# Patient Record
Sex: Male | Born: 1960
Health system: Southern US, Community
[De-identification: ages and names within clinical notes are randomized; demographics above are authoritative.]

## PROBLEM LIST (undated history)

## (undated) DIAGNOSIS — M1990 Unspecified osteoarthritis, unspecified site: Secondary | ICD-10-CM

## (undated) DIAGNOSIS — I10 Essential (primary) hypertension: Secondary | ICD-10-CM

## (undated) DIAGNOSIS — C801 Malignant (primary) neoplasm, unspecified: Secondary | ICD-10-CM

## (undated) HISTORY — PX: NO PAST SURGERIES: SHX2092

## (undated) HISTORY — DX: Unspecified osteoarthritis, unspecified site: M19.90

## (undated) HISTORY — DX: Essential (primary) hypertension: I10

---

## 2015-10-03 ENCOUNTER — Ambulatory Visit: Admission: RE | Admit: 2015-10-03 | Payer: 59 | Source: Ambulatory Visit | Admitting: Gastroenterology

## 2015-10-03 ENCOUNTER — Encounter: Admission: RE | Payer: Self-pay | Source: Ambulatory Visit

## 2015-10-03 SURGERY — COLONOSCOPY WITH PROPOFOL
Anesthesia: General

## 2018-01-05 DIAGNOSIS — L4050 Arthropathic psoriasis, unspecified: Secondary | ICD-10-CM | POA: Diagnosis not present

## 2018-03-07 ENCOUNTER — Ambulatory Visit
Admission: RE | Admit: 2018-03-07 | Discharge: 2018-03-07 | Disposition: A | Discharge status: Home / Self Care | Payer: 59 | Source: Ambulatory Visit | Attending: Rheumatology | Admitting: Rheumatology

## 2018-03-07 ENCOUNTER — Other Ambulatory Visit: Payer: Self-pay | Admitting: Rheumatology

## 2018-03-07 DIAGNOSIS — L03115 Cellulitis of right lower limb: Secondary | ICD-10-CM

## 2018-03-07 DIAGNOSIS — M7989 Other specified soft tissue disorders: Secondary | ICD-10-CM

## 2018-03-07 DIAGNOSIS — R600 Localized edema: Secondary | ICD-10-CM | POA: Diagnosis not present

## 2018-03-07 DIAGNOSIS — L4050 Arthropathic psoriasis, unspecified: Secondary | ICD-10-CM | POA: Diagnosis not present

## 2018-03-07 DIAGNOSIS — L409 Psoriasis, unspecified: Secondary | ICD-10-CM | POA: Diagnosis not present

## 2018-06-07 DIAGNOSIS — R600 Localized edema: Secondary | ICD-10-CM | POA: Diagnosis not present

## 2018-06-07 DIAGNOSIS — L4050 Arthropathic psoriasis, unspecified: Secondary | ICD-10-CM | POA: Diagnosis not present

## 2018-06-07 DIAGNOSIS — L409 Psoriasis, unspecified: Secondary | ICD-10-CM | POA: Diagnosis not present

## 2018-06-14 DIAGNOSIS — L4050 Arthropathic psoriasis, unspecified: Secondary | ICD-10-CM | POA: Diagnosis not present

## 2018-06-14 DIAGNOSIS — L409 Psoriasis, unspecified: Secondary | ICD-10-CM | POA: Diagnosis not present

## 2018-06-14 DIAGNOSIS — I10 Essential (primary) hypertension: Secondary | ICD-10-CM | POA: Diagnosis not present

## 2018-06-21 DIAGNOSIS — I10 Essential (primary) hypertension: Secondary | ICD-10-CM | POA: Diagnosis not present

## 2018-06-21 DIAGNOSIS — Z0001 Encounter for general adult medical examination with abnormal findings: Secondary | ICD-10-CM | POA: Diagnosis not present

## 2018-06-21 DIAGNOSIS — L4050 Arthropathic psoriasis, unspecified: Secondary | ICD-10-CM | POA: Diagnosis not present

## 2019-07-04 ENCOUNTER — Other Ambulatory Visit: Payer: Self-pay

## 2019-07-04 ENCOUNTER — Other Ambulatory Visit: Payer: Self-pay | Admitting: Urology

## 2019-07-04 ENCOUNTER — Encounter: Payer: Self-pay | Admitting: Urology

## 2019-07-04 ENCOUNTER — Ambulatory Visit (INDEPENDENT_AMBULATORY_CARE_PROVIDER_SITE_OTHER): Payer: 59 | Admitting: Urology

## 2019-07-04 VITALS — BP 193/96 | HR 88 | Ht 72.0 in | Wt 218.0 lb

## 2019-07-04 DIAGNOSIS — R3912 Poor urinary stream: Secondary | ICD-10-CM | POA: Diagnosis not present

## 2019-07-04 DIAGNOSIS — R972 Elevated prostate specific antigen [PSA]: Secondary | ICD-10-CM

## 2019-07-04 DIAGNOSIS — N402 Nodular prostate without lower urinary tract symptoms: Secondary | ICD-10-CM

## 2019-07-04 MED ORDER — TAMSULOSIN HCL 0.4 MG PO CAPS
0.4000 mg | ORAL_CAPSULE | Freq: Every day | ORAL | 11 refills | Status: DC
Start: 2019-07-04 — End: 2020-03-24

## 2019-07-04 NOTE — Patient Instructions (Signed)

## 2019-07-04 NOTE — Progress Notes (Signed)
07/04/2019 4:23 PM   Robert Wells Jun 16, 1961 [id number]  Referring provider: Baxter Hire, MD [place],  [place] [address]  Chief Complaint  Patient presents with  . Elevated PSA    New patient    HPI: 58 year old male who presents today for further evaluation of markedly elevated PSA.  PSA was checked on routine annual labs on 06/26/2019 and found to be markedly elevated to 117.  He reports that he is followed annually by his PCP and believes that his PSA was being checked although I see no record of this.  UA at the time is negative.  He does have personal history of psoriatic arthritis managed by Dr. Jefm Bryant.  He is currently taking a biologic for this.  His last dose was 1 week ago.  No family history of prostate cancer.  He does endorse today that over the past 6 months or so, he has had weaker urinary stream and urinary hesitancy.  Despite this, he does feel like he is emptying his bladder.  He gets up a few times at night to void but that unchanged.  The symptoms are new.  No dysuria or gross hematuria.  No weight loss or bone pain outside of his usual aches and pains secondary to severe psoriatic arthritis.  He does not take any blood thinners but he does take daily ibuprofen for arthritis.  PMH: Past Medical History:  Diagnosis Date  . Arthritis   . Hypertension     Surgical History: Past Surgical History:  Procedure Laterality Date  . NO PAST SURGERIES      Home Medications:  Allergies as of 07/04/2019      Reactions   Shellfish Allergy Itching, Hives, Nausea Only      Medication List       Accurate as of July 04, 2019 11:59 PM. If you have any questions, ask your nurse or doctor.        doxycycline 100 MG tablet Commonly known as: VIBRA-TABS Take by mouth.   IXEKIZUMAB [place] Inject into the skin.   lisinopril 20 MG tablet Commonly known as: ZESTRIL Take by mouth.   tamsulosin 0.4 MG Caps capsule Commonly  known as: Flomax Take 1 capsule (0.4 mg total) by mouth daily. Started by: Hollice Espy, MD       Allergies:  Allergies  Allergen Reactions  . Shellfish Allergy Itching, Hives and Nausea Only    Family History: History reviewed. No pertinent family history.  Social History:  reports that he has never smoked. He has never used smokeless tobacco. He reports previous alcohol use. No history on file for drug.  ROS: UROLOGY Frequent Urination?: Yes Hard to postpone urination?: Yes Burning/pain with urination?: No Get up at night to urinate?: Yes Leakage of urine?: No Urine stream starts and stops?: Yes Trouble starting stream?: No Do you have to strain to urinate?: No Blood in urine?: No Urinary tract infection?: No Sexually transmitted disease?: No Injury to kidneys or bladder?: No Painful intercourse?: No Weak stream?: No Erection problems?: Yes Penile pain?: No  Gastrointestinal Nausea?: No Vomiting?: No Diarrhea?: No Constipation?: No  Constitutional Fever: No Night sweats?: No Weight loss?: No Fatigue?: Yes  Skin Skin rash/lesions?: No Itching?: No  Eyes Blurred vision?: No Double vision?: No  Ears/Nose/Throat Sore throat?: No Sinus problems?: No  Hematologic/Lymphatic Swollen glands?: No Easy bruising?: No  Cardiovascular Leg swelling?: No Chest pain?: No  Respiratory Cough?: No Shortness of breath?: No  Endocrine Excessive  thirst?: No  Musculoskeletal Back pain?: No Joint pain?: Yes(Y)  Neurological Headaches?: No Dizziness?: No  Psychologic Depression?: No Anxiety?: No  Physical Exam: BP (!) 193/96   Pulse 88   Ht 6' (1.829 m)   Wt 218 lb (98.9 kg)   BMI 29.57 kg/m   Constitutional:  Alert and oriented, No acute distress. HEENT: [place] AT, moist mucus membranes.  Trachea midline, no masses. Cardiovascular: No clubbing, cyanosis, or edema. Respiratory: Normal respiratory effort, no increased work of breathing. GI:  Abdomen is soft, nontender, nondistended, no abdominal masses Rectal: Normal sphincter tone.  There is a firm nodule, at least 1 cm at the right base of the prostate with induration extending towards the apex.  Prostate is 50+ cc and diffusely firm in addition to the nodule.  Nontender. Skin: No rashes, bruises or suspicious lesions. Neurologic: Grossly intact, no focal deficits, moving all 4 extremities. Psychiatric: Normal mood and affect.  Laboratory Data: PSA as above  Creatinine, alk phos and hemoglobin all normal on 06/2019.  Urinalysis UA on 06/26/2019  Pertinent Imaging: N/A  Assessment & Plan:    1. Elevated PSA Markedly elevated PSA and abnormal rectal exam highly concerning for prostate cancer, possibly metastatic  I recommended evaluation with CT scan and bone scan for staging purposes as he will need this regardless  Plan for prostate biopsy.We discussed prostate biopsy in detail including the procedure itself, the risks of blood in the urine, stool, and ejaculate, serious infection, and discomfort. He is willing to proceed with this as discussed.  Given that he is on a biologic agent, concerned about the increased risk of sepsis/urinary tract infection following biopsy.  This was discussed at length.  I did reach out to his rheumatologist, Dr. Jefm Bryant who indicated that this in fact does pose theoretical increased risk of infection.  Ideally, the biopsy could be done at least 1 month following his last dose which was a week ago.  We will tentatively schedule him for in 1 month.  If his staging indicates any evidence of lymphadenopathy or other lesions that can be biopsied outside the prostate, will pursue that is likely lower risk for infection.  The patient is agreeable this plan.  All questions were answered. - CT Abdomen Pelvis W Contrast; Future - NM Bone Scan Whole Body; Future - PSA  2. Prostate nodule As above  3. Weak urinary stream Obstructive urinary  symptoms possibly related to development of prostate cancer as above  We discussed in the interim, we will try a medication such as Flomax relaxes smooth muscle to see if it helps him void more efficiently.  He is agreeable this plan.  Possible side effects including dizziness and retrograde ejaculation discussed.  He is agreeable this plan.   Return in about 4 weeks (around 08/01/2019) for prostate biopsy.  Hollice Espy, MD  [facility] [address] [place], [place] [address] [phone]  I spent 45 min with this patient of which greater than 50% was spent in counseling and coordination of care with the patient.   Case was discussed with his rheumatologist, Dr. Jefm Bryant personally.

## 2019-07-05 ENCOUNTER — Telehealth: Payer: Self-pay | Admitting: Urology

## 2019-07-05 NOTE — Telephone Encounter (Signed)
Please let this patient know that I was able to speak with Dr. Jefm Bryant.  Ideally, we would wait until after your next dose is due to perform a prostate biopsy due to the theoretical increased risk of sepsis/infection.  As such, looks keep your prostate biopsy for as scheduled.  In the meantime, please get the CT scan and bone scan.  If another lesion is identified outside of the prostate, we may try to target that which I will have a low risk of infection.  Please let us know if you have any questions.  Hollice Espy, MD

## 2019-07-06 NOTE — Telephone Encounter (Signed)
He should hold his dose on 07/31/2019.  Hollice Espy, MD

## 2019-07-06 NOTE — Telephone Encounter (Signed)
Patient informed verbalized understanding

## 2019-07-06 NOTE — Telephone Encounter (Addendum)
Patient informed, his next dose is scheduled for 07/31/19. He would like to know if that will that be plenty of time after receiving the dose to proceed with biopsy as scheduled?

## 2019-07-11 LAB — SPECIMEN STATUS REPORT

## 2019-07-11 LAB — PSA: Prostate Specific Ag, Serum: 99.2 ng/mL — ABNORMAL HIGH (ref 0.0–4.0)

## 2019-07-23 ENCOUNTER — Ambulatory Visit
Admission: RE | Admit: 2019-07-23 | Discharge: 2019-07-23 | Disposition: A | Discharge status: Home / Self Care | Payer: 59 | Source: Ambulatory Visit | Attending: Urology | Admitting: Urology

## 2019-07-23 ENCOUNTER — Other Ambulatory Visit: Payer: Self-pay | Admitting: Urology

## 2019-07-23 ENCOUNTER — Other Ambulatory Visit: Payer: Self-pay

## 2019-07-23 DIAGNOSIS — R972 Elevated prostate specific antigen [PSA]: Secondary | ICD-10-CM | POA: Insufficient documentation

## 2019-07-23 HISTORY — DX: Malignant (primary) neoplasm, unspecified: C80.1

## 2019-07-23 MED ORDER — IOHEXOL 300 MG/ML  SOLN
100.0000 mL | Freq: Once | INTRAMUSCULAR | Status: AC | PRN
  Administered 2019-07-23: 100 mL via INTRAVENOUS

## 2019-07-23 MED ORDER — TECHNETIUM TC 99M MEDRONATE IV KIT
20.0000 | PACK | Freq: Once | INTRAVENOUS | Status: AC | PRN
  Administered 2019-07-23: 23.59 via INTRAVENOUS

## 2019-07-24 ENCOUNTER — Telehealth: Payer: Self-pay | Admitting: *Deleted

## 2019-07-24 NOTE — Telephone Encounter (Addendum)
Left Vm to return my call   ----- Message from Hollice Espy, MD sent at 07/23/2019  6:03 PM EST ----- There is a slightly enlarged lymph node near your great blood vessels and inguinal area.  I am good at present and reviewed this scan at tumor board on Thursday to see if any of these can be biopsied instead of your prostate.  I will be in touch with you at that point.  Overall, there is nothing significant on the scans which is good news.  Hollice Espy, MD

## 2019-07-24 NOTE — Telephone Encounter (Signed)
Pt returned call and I read message from Dr Erlene Quan.

## 2019-07-26 ENCOUNTER — Other Ambulatory Visit: Payer: 59

## 2019-07-26 NOTE — Progress Notes (Signed)
Tumor Board Documentation  Robert Wells was presented by Dr Grayland Ormond at our Tumor Board on 07/26/2019, which included representatives from medical oncology, radiation oncology, surgical oncology, surgical, radiology, pathology, navigation, genetics, pharmacy, internal medicine, palliative care, research.  Robert Wells currently presents as an external consult, for discussion with history of the following treatments: active survellience.  Additionally, we reviewed previous medical and familial history, history of present illness, and recent lab results along with all available histopathologic and imaging studies. The tumor board considered available treatment options and made the following recommendations: Biopsy, Additional screening PET Scan, Consensus for prostate biopsy  The following procedures/referrals were also placed: No orders of the defined types were placed in this encounter.   Clinical Trial Status: not discussed   Staging used: Not Applicable  National site-specific guidelines   were discussed with respect to the case.  Tumor board is a meeting of clinicians from various specialty areas who evaluate and discuss patients for whom a multidisciplinary approach is being considered. Final determinations in the plan of care are those of the provider(s). The responsibility for follow up of recommendations given during tumor board is that of the provider.   Today's extended care, comprehensive team conference, Robert Wells was not present for the discussion and was not examined.   Multidisciplinary Tumor Board is a multidisciplinary case peer review process.  Decisions discussed in the Multidisciplinary Tumor Board reflect the opinions of the specialists present at the conference without having examined the patient.  Ultimately, treatment and diagnostic decisions rest with the primary provider(s) and the patient.

## 2019-07-31 ENCOUNTER — Ambulatory Visit: Payer: 59

## 2019-08-01 ENCOUNTER — Ambulatory Visit (INDEPENDENT_AMBULATORY_CARE_PROVIDER_SITE_OTHER): Payer: 59 | Admitting: Urology

## 2019-08-01 ENCOUNTER — Other Ambulatory Visit: Payer: Self-pay | Admitting: Urology

## 2019-08-01 ENCOUNTER — Encounter: Payer: Self-pay | Admitting: Urology

## 2019-08-01 ENCOUNTER — Other Ambulatory Visit: Payer: Self-pay

## 2019-08-01 VITALS — BP 153/84 | HR 91 | Ht 72.0 in | Wt 218.0 lb

## 2019-08-01 DIAGNOSIS — R972 Elevated prostate specific antigen [PSA]: Secondary | ICD-10-CM | POA: Diagnosis not present

## 2019-08-01 MED ORDER — GENTAMICIN SULFATE 40 MG/ML IJ SOLN
80.0000 mg | Freq: Once | INTRAMUSCULAR | Status: AC
  Administered 2019-08-01: 80 mg via INTRAMUSCULAR

## 2019-08-01 MED ORDER — LEVOFLOXACIN 500 MG PO TABS
500.0000 mg | ORAL_TABLET | Freq: Once | ORAL | Status: AC
  Administered 2019-08-01: 500 mg via ORAL

## 2019-08-01 NOTE — Progress Notes (Signed)
   08/01/19  CC:  Chief Complaint  Patient presents with  . Prostate Biopsy    HPI: 58 year old male with a personal history of markedly elevated PSA of 99.2 who presents today for prostate biopsy.  Biopsy was delayed so that his biologic agent for rheumatoid arthritis could be held.  His last dose was more than a month ago.  This was cleared with his rheumatologist.  In the interim, he underwent CT scan and bone scan which showed borderline adenopathy but no other evidence of metastatic disease.  Case was discussed at tumor board.  Blood pressure (!) 153/84, pulse 91, height 6' (1.829 m), weight 218 lb (98.9 kg). NED. A&Ox3.   No respiratory distress   Abd soft, NT, ND Normal sphincter tone  Prostate Biopsy Procedure   Informed consent was obtained after discussing risks/benefits of the procedure.  A time out was performed to ensure correct patient identity.  Pre-Procedure: - Gentamicin given prophylactically - Levaquin 500 mg administered PO -Transrectal Ultrasound performed revealing a 56.7 gm prostate -No significant hypoechoic or median lobe noted  Procedure: - Prostate block performed using 10 cc 1% lidocaine and biopsies taken from sextant areas, a total of 12 under ultrasound guidance.  Post-Procedure: - Patient tolerated the procedure well - He was counseled to seek immediate medical attention if experiences any severe pain, significant bleeding, or fevers -Patient has a follow-up appointment early January discuss his pathology results, in the interim, will order axium PET scan to help differentiate whether or not his borderline adenopathy is related to prostate cancer incidental -Okay to treatment for rheumatoid arthritis next week   Hollice Espy, MD

## 2019-08-07 ENCOUNTER — Other Ambulatory Visit: Payer: 59 | Admitting: Urology

## 2019-08-13 LAB — ANATOMIC PATHOLOGY REPORT: PDF Image: 0

## 2019-08-14 ENCOUNTER — Telehealth: Payer: Self-pay | Admitting: Urology

## 2019-08-14 NOTE — Telephone Encounter (Signed)
Please ensure that it was ordered at [facility] PET scan which is indicated for Prostate cancer

## 2019-08-14 NOTE — Telephone Encounter (Signed)
Patient's insurance denied his PET scan, they said that the PET scan ordered [id number] is not indicated for prostate cancer. Please advise    Thanks, Sharyn Lull

## 2019-08-21 ENCOUNTER — Ambulatory Visit: Payer: 59 | Admitting: Urology

## 2019-08-21 ENCOUNTER — Other Ambulatory Visit: Payer: Self-pay

## 2019-08-21 ENCOUNTER — Encounter: Payer: Self-pay | Admitting: Urology

## 2019-08-21 ENCOUNTER — Other Ambulatory Visit: Payer: Self-pay | Admitting: Urology

## 2019-08-21 ENCOUNTER — Ambulatory Visit (INDEPENDENT_AMBULATORY_CARE_PROVIDER_SITE_OTHER): Payer: 59 | Admitting: Urology

## 2019-08-21 VITALS — BP 170/100 | HR 80 | Ht 72.0 in | Wt 207.0 lb

## 2019-08-21 DIAGNOSIS — C61 Malignant neoplasm of prostate: Secondary | ICD-10-CM

## 2019-08-21 DIAGNOSIS — R590 Localized enlarged lymph nodes: Secondary | ICD-10-CM | POA: Diagnosis not present

## 2019-08-21 HISTORY — DX: Malignant neoplasm of prostate: C61

## 2019-08-21 MED ORDER — DOXYCYCLINE HYCLATE 100 MG PO CAPS: 100.0000 mg | ORAL_CAPSULE | Freq: Two times a day (BID) | ORAL | 0 refills | Status: DC

## 2019-08-21 NOTE — Progress Notes (Signed)
08/21/2019 2:08 PM   [name] 02-06-1961 [id number]  Referring provider: Baxter Hire, MD [place],  [place] [address]  Chief Complaint  Patient presents with  . Results    HPI: 59 year old male with newly diagnosed high risk prostate cancer who returns today for follow-up after prostate biopsy.  He initially presented with a PSA of around 100.  Stress exam was grossly irregular diffusely firm and nodular.  He underwent prostate biopsy after holding his biologic agent which he takes for psoriatic arthritis.  Prostate biopsy reveals high-volume high risk Gleason 4+4 disease up to 100% of the tissue.  No complications for prostate biopsy.  He does have baseline weak urinary stream which has been progressing over the last several months likely related to his prostate cancer.  No baseline erectile dysfunction.  Bone scan - 07/2019.  CT scan abdomen pelvis with contrast on 07/2019 showed some borderline distal right common iliac lymph nodes measuring 9 mm but none of which reach true pathologic criteria.  Notably, he does have bilateral inguinal lymph node lymphadenopathy measuring 1.7 cm on the right and 1.3 cm on the left.  Related to this, he does report that he has been having some pain in his left groin which correlates with a note on CT scan finding.  Is radiated into his left lower quadrant.  This is been going on for several weeks.  He has no pain on the right side.   PMH: Past Medical History:  Diagnosis Date  . Arthritis   . Cancer ([place])   . Hypertension     Surgical History: Past Surgical History:  Procedure Laterality Date  . NO PAST SURGERIES      Home Medications:  Allergies as of 08/21/2019      Reactions   Shellfish Allergy Itching, Hives, Nausea Only      Medication List       Accurate as of August 21, 2019 11:59 PM. If you have any questions, ask your nurse or doctor.        doxycycline 100 MG capsule Commonly  known as: VIBRAMYCIN Take 1 capsule (100 mg total) by mouth every 12 (twelve) hours. Started by: Hollice Espy, MD   IXEKIZUMAB [place] Inject into the skin.   lisinopril 20 MG tablet Commonly known as: ZESTRIL Take by mouth.   tamsulosin 0.4 MG Caps capsule Commonly known as: Flomax Take 1 capsule (0.4 mg total) by mouth daily.       Allergies:  Allergies  Allergen Reactions  . Shellfish Allergy Itching, Hives and Nausea Only    Family History: No family history on file.  Social History:  reports that he has never smoked. He has never used smokeless tobacco. He reports previous alcohol use. No history on file for drug.  ROS: 12 point review systems is negative other than as per HPI.  Physical Exam: BP (!) 170/100   Pulse 80   Ht 6' (1.829 m)   Wt 207 lb (93.9 kg)   BMI 28.07 kg/m   Constitutional:  Alert and oriented, No acute distress. HEENT: [place] AT, moist mucus membranes.  Trachea midline, no masses. Cardiovascular: No clubbing, cyanosis, or edema. Respiratory: Normal respiratory effort, no increased work of breathing. GU: Normal phallus with descended testicles bilaterally.  Inguinal adenopathy is palpated bilaterally, left greater than right with tenderness of the left inguinal node. Skin: No rashes, bruises or suspicious lesions. Neurologic: Grossly intact, no focal deficits, moving all 4 extremities. Psychiatric: Normal  mood and affect.  Laboratory Data: Labs as above   Pertinent Imaging: Above CT scan and bone scan were reviewed again personally today.  Findings were correlated with physical exam as indicated by..  Assessment & Plan:    1. Prostate cancer ([place]) High risk high-volume prostate cancer, at least T2b clinically/PSA 99 confirmed now by prostate biopsy as above  Thus far, staging is somewhat equivocal.  Bone scan is negative but there is evidence of inguinal lymphadenopathy which is not typical of prostate cancer.  Given that there is  tenderness especially at the left inguinal lymph node, I recommended treating this with antibiotics to see if there is an inflammatory/infectious component.  He is agreeable this plan.  He does have borderline external iliac and borderline adenopathy along the great vessels.  These do not reach pathologic criteria although given his overall clinical history, is highly suspicious for metastatic disease.  As per our previous conversations, I have strongly recommended axman PET scan which was also recommended by tumor board members.  Unfortunately his insurance company has denied the scan.  Face-to-face was performed and also denied.  We have an appeal pending.  This would be exceptionally helpful in helping to decide whether we treat him definitively for disease confined to the pelvis versus for metastatic disease.  Moving forward, we discussed that if he does have evidence of disease outside of the traditional pelvic landing zones, we would treat him with ADT likely with additional adjuvant oral therapies or even consideration of chemotherapy given his otherwise young and healthy age.  At this point, we would likely refer him to medical oncology as well for assistance with discussing his treatment options.  If the disease is confined to his pelvis, we could offer him whole pelvic radiation with ADT for 2 to 3 years and then possibly intermittent ADT thereafter.  The role for surgery at this point is somewhat limited as there is a very strong likelihood that he has at minimum micrometastatic disease which is likely quite advanced.  He would most certainly require radiation after surgery which likely would not be completely sufficient.  We will plan to revisit his options next week once you receive final word from his insurance company.  Again, axman PET scan in this particular clinical situation would be of utmost importance/benefit.  2. Inguinal lymphadenopathy As above, doxycycline    Return in  about 1 week (around 08/28/2019) for viritual visit for prostate cancer.  Hollice Espy, MD  [facility] [address] [place], [place] [address] [phone]  I spent 40 min with this patient of which greater than 50% was spent in counseling and coordination of care with the patient.   Case was also discussed with several of my partners as well as colleagues at the [facility] center.

## 2019-08-24 ENCOUNTER — Telehealth: Payer: Self-pay | Admitting: Urology

## 2019-08-24 DIAGNOSIS — C61 Malignant neoplasm of prostate: Secondary | ICD-10-CM

## 2019-08-24 NOTE — Telephone Encounter (Signed)
Unfortunately, unable to get axman PET scan approved despite 2 peer-to-peer reviews and appeals.  At this point, as per previous note, we will give him the benefit the doubt and plan for whole pelvic radiation with ADT for 3 years.  Radiation oncology referral placed today.  We will plan to get him started on Firmagon next week in the office.  He will need to follow-up with me 1 month after his first injection.  All questions answered.  He is agreeable this plan.

## 2019-08-28 ENCOUNTER — Other Ambulatory Visit: Payer: Self-pay

## 2019-08-28 ENCOUNTER — Ambulatory Visit (INDEPENDENT_AMBULATORY_CARE_PROVIDER_SITE_OTHER): Payer: 59 | Admitting: *Deleted

## 2019-08-28 DIAGNOSIS — C61 Malignant neoplasm of prostate: Secondary | ICD-10-CM

## 2019-08-28 MED ORDER — DEGARELIX ACETATE(240 MG DOSE) 120 MG/VIAL ~~LOC~~ SOLR
240.0000 mg | Freq: Once | SUBCUTANEOUS | Status: AC
  Administered 2019-08-28: 240 mg via SUBCUTANEOUS

## 2019-08-28 NOTE — Progress Notes (Signed)
Firmagon Sub Q Injection  Due to Prostate Cancer patient is present today for a Firmagon Injection.   Medication: Mills Koller (Degarelix)  Dose: 240mg  Location: Left & Right upper abdomen Lot: [id number] Exp: [id number]  Patient tolerated well, no complications were noted  Performed by: Fleet Contras CMA  Follow up: one month

## 2019-08-28 NOTE — Patient Instructions (Addendum)
Please take Vitamin D  1000 mg and calcium 1200 daily.    Degarelix injection What is this medicine? DEGARELIX (deg a REL ix) is used to treat men with advanced prostate cancer. This medicine may be used for other purposes; ask your health care provider or pharmacist if you have questions. COMMON BRAND NAME(S): Degarelix, Mills Koller What should I tell my health care provider before I take this medicine? They need to know if you have any of these conditions:  diabetes  heart disease  kidney disease  liver disease  low levels of potassium or magnesium in the blood  osteoporosis  an unusual or allergic reaction to degarelix, mannitol, other medicines, foods, dyes, or preservatives  pregnant or trying to get pregnant  breast-feeding How should I use this medicine? This medicine is for injection under the skin. It is usually given by a health care professional in a hospital or clinic setting. If you get this medicine at home, you will be taught how to prepare and give this medicine. Use exactly as directed. Take your medicine at regular intervals. Do not take it more often than directed. It is important that you put your used needles and syringes in a special sharps container. Do not put them in a trash can. If you do not have a sharps container, call your pharmacist or healthcare provider to get one. Talk to your pediatrician regarding the use of this medicine in children. Special care may be needed. Overdosage: If you think you have taken too much of this medicine contact a poison control center or emergency room at once. NOTE: This medicine is only for you. Do not share this medicine with others. What if I miss a dose? Try not to miss a dose. If you do miss a dose, call your doctor or health care professional for advice. What may interact with this medicine? Do not take this medicine with any of the following  medications:  amiodarone  bretylium  disopyramide  droperidol  ibutilide  procainamide  quinidine  sotalol This medicine may also interact with the following medications:  dofetilide This list may not describe all possible interactions. Give your health care provider a list of all the medicines, herbs, non-prescription drugs, or dietary supplements you use. Also tell them if you smoke, drink alcohol, or use illegal drugs. Some items may interact with your medicine. What should I watch for while using this medicine? Visit your doctor or health care professional for regular checks on your progress and discuss any issues before you start taking this medicine. Do not rub or scratch injection site. There may be a lump at the injection site, or it may be red or sore for a few days after your dose. Your doctor or health care professional will need to monitor your hormone levels in your blood to check your response to treatment. Try to keep any appointments for testing. What side effects may I notice from receiving this medicine? Side effects that you should report to your doctor or health care professional as soon as possible:  allergic reactions like skin rash, itching or hives, swelling of the face, lips, or tongue  fever or chills  irregular heartbeat  nausea and vomiting along with severe abdominal pain  pain or difficulty passing urine  pelvic pain or bloating  signs and symptoms of high blood sugar such as being more thirsty or hungry or having to urinate more than normal. You may also feel very tired or have blurry vision Side  effects that usually do not require medical attention (report to your doctor or health care professional if they continue or are bothersome):  change in sex drive or performance  constipation  headache  high blood pressure  hot flashes (flushing of skin, increased sweating)  itching, redness or mild pain at site where injected  joint  pain  trouble sleeping  unusually weak or tired  weight gain This list may not describe all possible side effects. Call your doctor for medical advice about side effects. You may report side effects to FDA at 1-800-FDA-1088. Where should I keep my medicine? Keep out of the reach of children. This drug is usually given in a hospital or clinic and will not be stored at home. In rare cases, this medicine may be given at home. If you are using this medicine at home, you will be instructed on how to store this medicine. Throw away any unused medicine after the expiration date on the label. NOTE: This sheet is a summary. It may not cover all possible information. If you have questions about this medicine, talk to your doctor, pharmacist, or health care provider.  2020 Elsevier/Gold Standard (2018-10-16 12:20:52)   Patie

## 2019-08-30 ENCOUNTER — Telehealth: Payer: Self-pay

## 2019-08-30 ENCOUNTER — Encounter: Payer: Self-pay | Admitting: Urology

## 2019-08-30 NOTE — Telephone Encounter (Signed)
PA started online for Robert Wells, need Appeal for medication code [phone]

## 2019-08-31 NOTE — Telephone Encounter (Signed)
[employer] was contact and states a letter for appeal and notes and labs must be faxed to [phone]

## 2019-09-02 ENCOUNTER — Encounter: Payer: Self-pay | Admitting: Urology

## 2019-09-03 NOTE — Telephone Encounter (Signed)
Letter was faxed with notes and labs attached

## 2019-09-06 ENCOUNTER — Ambulatory Visit
Admission: RE | Admit: 2019-09-06 | Discharge: 2019-09-06 | Disposition: A | Discharge status: Home / Self Care | Payer: 59 | Source: Ambulatory Visit | Attending: Radiation Oncology | Admitting: Radiation Oncology

## 2019-09-06 ENCOUNTER — Other Ambulatory Visit: Payer: Self-pay | Admitting: *Deleted

## 2019-09-06 ENCOUNTER — Other Ambulatory Visit: Payer: Self-pay

## 2019-09-06 ENCOUNTER — Encounter: Payer: Self-pay | Admitting: Urology

## 2019-09-06 VITALS — BP 159/90 | HR 76 | Temp 96.8°F | Resp 16 | Wt 207.6 lb

## 2019-09-06 DIAGNOSIS — M129 Arthropathy, unspecified: Secondary | ICD-10-CM | POA: Insufficient documentation

## 2019-09-06 DIAGNOSIS — Z79899 Other long term (current) drug therapy: Secondary | ICD-10-CM | POA: Insufficient documentation

## 2019-09-06 DIAGNOSIS — I10 Essential (primary) hypertension: Secondary | ICD-10-CM | POA: Insufficient documentation

## 2019-09-06 DIAGNOSIS — R351 Nocturia: Secondary | ICD-10-CM | POA: Insufficient documentation

## 2019-09-06 DIAGNOSIS — C61 Malignant neoplasm of prostate: Secondary | ICD-10-CM

## 2019-09-06 NOTE — Consult Note (Signed)
NEW PATIENT EVALUATION  Name: Robert Wells  MRN: [id number]  Date:   09/06/2019     DOB: 1961-07-07   This 59 y.o. male patient presents to the clinic for initial evaluation of at least stage IIb Gleason 8 (4+4) adenocarcinoma the prostate presenting with a PSA of 100.  REFERRING PHYSICIAN: Baxter Hire, MD  CHIEF COMPLAINT:  Chief Complaint  Patient presents with  . Prostate Cancer    Initial consultation     DIAGNOSIS: The encounter diagnosis was Prostate cancer ([place]).   PREVIOUS INVESTIGATIONS:  Bone scan and CT scan of abdomen pelvis reviewed Clinical notes reviewed Pathology report reviewed  HPI: Patient is a 59 year old male who presented with an initial PSA reading of approximately 100.  He had on rectal examination diffuse firm and nodular prostate.  This prompted transrectal ultrasound-guided biopsy showing almost 100% of the tissue involving adenocarcinoma mostly Gleason 7 (4+3) although there was Gleason 8 (4+4).  Bone scan showed no evidence to suggest metastatic disease.  He had a CT scan of the abdomen pelvis showing borderline large right common iliac nodes and enlarged inguinal nodes.  Metastatic disease cannot be excluded.  There was also mild prostate enlargement.  PET CT scan was declined by his insurance company.  He was put on a trial of antibiotic therapy although still having some tenderness especially in his left inguinal region.  He is seen today for radiation oncology consultation.  He has nocturia x2 no significant frequency or urgency.  No problems with his bowels.  He specifically denies bone pain.  Patient has been started on androgen deprivation therapy by urology and tolerating that well.  PLANNED TREATMENT REGIMEN: IMRT radiation therapy to prostate and pelvic nodes as well as I-125 interstitial implant for boost along with androgen deprivation therapy  PAST MEDICAL HISTORY:  has a past medical history of Arthritis, Cancer ([place]), and  Hypertension.    PAST SURGICAL HISTORY:  Past Surgical History:  Procedure Laterality Date  . NO PAST SURGERIES      FAMILY HISTORY: family history is not on file.  SOCIAL HISTORY:  reports that he has never smoked. He has never used smokeless tobacco. He reports previous alcohol use.  ALLERGIES: Shellfish allergy  MEDICATIONS:  Current Outpatient Medications  Medication Sig Dispense Refill  . IXEKIZUMAB [place] Inject into the skin every 30 (thirty) days.     Marland Kitchen lisinopril (ZESTRIL) 20 MG tablet Take by mouth.    . tamsulosin (FLOMAX) 0.4 MG CAPS capsule Take 1 capsule (0.4 mg total) by mouth daily. 30 capsule 11   No current facility-administered medications for this encounter.    ECOG PERFORMANCE STATUS:  0 - Asymptomatic  REVIEW OF SYSTEMS: Patient denies any weight loss, fatigue, weakness, fever, chills or night sweats. Patient denies any loss of vision, blurred vision. Patient denies any ringing  of the ears or hearing loss. No irregular heartbeat. Patient denies heart murmur or history of fainting. Patient denies any chest pain or pain radiating to her upper extremities. Patient denies any shortness of breath, difficulty breathing at night, cough or hemoptysis. Patient denies any swelling in the lower legs. Patient denies any nausea vomiting, vomiting of blood, or coffee ground material in the vomitus. Patient denies any stomach pain. Patient states has had normal bowel movements no significant constipation or diarrhea. Patient denies any dysuria, hematuria or significant nocturia. Patient denies any problems walking, swelling in the joints or loss of balance. Patient denies any skin changes, loss of hair  or loss of weight. Patient denies any excessive worrying or anxiety or significant depression. Patient denies any problems with insomnia. Patient denies excessive thirst, polyuria, polydipsia. Patient denies any swollen glands, patient denies easy bruising or easy bleeding. Patient  denies any recent infections, allergies or URI. Patient "s visual fields have not changed significantly in recent time.   PHYSICAL EXAM: BP (!) 159/90   Pulse 76   Temp (!) 96.8 F (36 C)   Resp 16   Wt 207 lb 9.6 oz (94.2 kg)   SpO2 99%   BMI 28.16 kg/m  Well-developed well-nourished patient in NAD. HEENT reveals PERLA, EOMI, discs not visualized.  Oral cavity is clear. No oral mucosal lesions are identified. Neck is clear without evidence of cervical or supraclavicular adenopathy. Lungs are clear to A&P. Cardiac examination is essentially unremarkable with regular rate and rhythm without murmur rub or thrill. Abdomen is benign with no organomegaly or masses noted. Motor sensory and DTR levels are equal and symmetric in the upper and lower extremities. Cranial nerves II through XII are grossly intact. Proprioception is intact. No peripheral adenopathy or edema is identified. No motor or sensory levels are noted. Crude visual fields are within normal range.  LABORATORY DATA: Pathology report reviewed    RADIOLOGY RESULTS: Bone scan and CT scan abdomen pelvis reviewed   IMPRESSION: At least stage IIb adenocarcinoma of the prostate in 59 year old male  PLAN: At this time I have ordered a CT-guided biopsy of his right inguinal node.  It would be important to classify whether the inguinal nodes which would be unusual are involved with metastatic disease.  If that is negative would proceed with IMRT radiation therapy to his prostate and pelvic nodes.  I would also boost his prostate with low-dose rate I-125 interstitial implant.  Risks and benefits of treatment including increased lower urinary tract symptoms diarrhea fatigue alteration of blood count skin reaction and radiation safety precautions all were discussed in detail with the patient.  I will set him up and ordered needle biopsy and will see the patient shortly thereafter to discuss those results.  Patient comprehends my treatment plan  well.  I would like to take this opportunity to thank you for allowing me to participate in the care of your patient.Noreene Filbert, MD

## 2019-09-07 NOTE — Telephone Encounter (Signed)
Patient was referring to treatments at the [facility]. Recommended paperwork would best be filled out by his oncologist. Questions answered. Patient expresses understanding.

## 2019-09-07 NOTE — Telephone Encounter (Signed)
LMOM for return call regarding FMLA paperwork.

## 2019-09-09 ENCOUNTER — Encounter: Payer: Self-pay | Admitting: Radiation Oncology

## 2019-09-18 ENCOUNTER — Other Ambulatory Visit: Payer: Self-pay | Admitting: Student

## 2019-09-18 ENCOUNTER — Other Ambulatory Visit: Payer: Self-pay | Admitting: Radiology

## 2019-09-19 ENCOUNTER — Ambulatory Visit: Payer: 59

## 2019-09-19 ENCOUNTER — Ambulatory Visit
Admission: RE | Admit: 2019-09-19 | Discharge: 2019-09-19 | Disposition: A | Discharge status: Home / Self Care | Payer: 59 | Source: Ambulatory Visit | Attending: Radiation Oncology | Admitting: Radiation Oncology

## 2019-09-19 ENCOUNTER — Other Ambulatory Visit: Payer: Self-pay

## 2019-09-19 DIAGNOSIS — C61 Malignant neoplasm of prostate: Secondary | ICD-10-CM | POA: Diagnosis not present

## 2019-09-19 MED ORDER — SODIUM CHLORIDE 0.9 % IV SOLN: INTRAVENOUS | Status: DC

## 2019-09-19 NOTE — Procedures (Signed)
Interventional Radiology Procedure:   Indications: Prostate cancer and prominent right inguinal lymph node.  Procedure: US guided right inguinal lymph node biopsy  Findings: 6 cores obtained.  Complications: None     EBL: less than 10 ml  Plan: Discharge to home.    Seairra Otani R. Anselm Pancoast, MD  Pager: [phone]

## 2019-09-20 ENCOUNTER — Encounter: Payer: Self-pay | Admitting: Radiation Oncology

## 2019-09-20 LAB — SURGICAL PATHOLOGY

## 2019-09-27 ENCOUNTER — Ambulatory Visit
Admission: RE | Admit: 2019-09-27 | Discharge: 2019-09-27 | Disposition: A | Discharge status: Home / Self Care | Payer: 59 | Source: Ambulatory Visit | Attending: Radiation Oncology | Admitting: Radiation Oncology

## 2019-09-27 ENCOUNTER — Encounter: Payer: Self-pay | Admitting: Radiation Oncology

## 2019-09-27 ENCOUNTER — Other Ambulatory Visit: Payer: Self-pay

## 2019-09-27 VITALS — BP 151/84 | HR 82 | Resp 16 | Wt 205.0 lb

## 2019-09-27 DIAGNOSIS — C61 Malignant neoplasm of prostate: Secondary | ICD-10-CM | POA: Insufficient documentation

## 2019-09-27 NOTE — Progress Notes (Signed)
Radiation Oncology Follow up Note  Name: Robert Wells   Date:   09/27/2019 MRN:  [id number] DOB: [birth year]    This 59 y.o. male presents to the clinic today for results of inguinal node biopsy and patient with stage IIb Gleason 8 adenocarcinoma the prostate.  REFERRING PROVIDER: Baxter Hire, MD  HPI: Patient is a 59 year old male who I originally consulted for Gleason 8 (4+4) adenocarcinoma the prostate presenting with a PSA of 100.Marland Kitchen  Bone scan showed no evidence suggest metastatic disease CT scan showed borderline large right common iliac nodes and enlarged inguinal nodes.  Metastatic disease cannot be excluded.  We ordered CT-guided biopsy of his inguinal nodes which was negative showed reactive node no evidence of malignancy.  He is here today to discuss treatment planning.  Patient is already been started on androgen deprivation therapy and tolerating that well.  COMPLICATIONS OF TREATMENT: none  FOLLOW UP COMPLIANCE: keeps appointments   PHYSICAL EXAM:  BP (!) 151/84 (BP Location: Right Arm, Patient Position: Sitting, Cuff Size: Normal)   Pulse 82   Resp 16   Wt 205 lb (93 kg)   BMI 27.80 kg/m  Well-developed well-nourished patient in NAD. HEENT reveals PERLA, EOMI, discs not visualized.  Oral cavity is clear. No oral mucosal lesions are identified. Neck is clear without evidence of cervical or supraclavicular adenopathy. Lungs are clear to A&P. Cardiac examination is essentially unremarkable with regular rate and rhythm without murmur rub or thrill. Abdomen is benign with no organomegaly or masses noted. Motor sensory and DTR levels are equal and symmetric in the upper and lower extremities. Cranial nerves II through XII are grossly intact. Proprioception is intact. No peripheral adenopathy or edema is identified. No motor or sensory levels are noted. Crude visual fields are within normal range.  RADIOLOGY RESULTS: CT scans again reviewed  PLAN: At this time like to go  ahead with IMRT radiation therapy to his prostate and pelvic nodes.  I would also offer a boost of I-125 interstitial implant.  Risks and benefits of treatment including increased lower urinary tract symptoms diarrhea fatigue alteration of blood count skin reaction all were discussed with the patient have also gone over implant information including symptoms as well as radiation safety precautions.  Patient comprehends her treatment plan well.  I have personally set up and ordered CT simulation for next week.  I would like to take this opportunity to thank you for allowing me to participate in the care of your patient.Noreene Filbert, MD

## 2019-10-02 ENCOUNTER — Encounter: Payer: Self-pay | Admitting: Urology

## 2019-10-02 ENCOUNTER — Ambulatory Visit (INDEPENDENT_AMBULATORY_CARE_PROVIDER_SITE_OTHER): Payer: 59 | Admitting: Urology

## 2019-10-02 ENCOUNTER — Other Ambulatory Visit: Payer: Self-pay

## 2019-10-02 ENCOUNTER — Ambulatory Visit
Admission: RE | Admit: 2019-10-02 | Discharge: 2019-10-02 | Disposition: A | Discharge status: Home / Self Care | Payer: 59 | Source: Ambulatory Visit | Attending: Radiation Oncology | Admitting: Radiation Oncology

## 2019-10-02 VITALS — BP 152/83 | HR 106 | Ht 72.0 in | Wt 205.0 lb

## 2019-10-02 DIAGNOSIS — C61 Malignant neoplasm of prostate: Secondary | ICD-10-CM

## 2019-10-02 MED ORDER — LEUPROLIDE ACETATE (6 MONTH) 45 MG ~~LOC~~ KIT
45.0000 mg | PACK | Freq: Once | SUBCUTANEOUS | Status: AC
  Administered 2019-10-02: 45 mg via SUBCUTANEOUS

## 2019-10-02 NOTE — Progress Notes (Signed)
   10/02/2019 1:18 PM   Robert Wells 1961/05/19 [id number]  Referring provider: Baxter Hire, MD [place],  [place] [address]  Chief Complaint  Patient presents with  . Prostate Cancer    follow up    HPI: 59 year old male with prostate cancer who returns today for his next ADT injection.  He initially presented with a PSA of around 100.  Prostate biopsy revealed high risk Gleason 4+4 disease.  Staging indicated borderline distal right common iliac lymph nodes measuring up to 9 mm which did not reach true pathologic criteria.  He also had bilateral inguinal lymphadenopathy status post recent biopsy not consistent with prostate cancer.  He received his first [place] injection last month and is due for his next ADT today.  He has been tolerating this really well.  He is scheduled to begin IMRT with brachytherapy boost down the road.  No voiding issues.   PMH: Past Medical History:  Diagnosis Date  . Arthritis   . Cancer ([place])   . Hypertension     Surgical History: Past Surgical History:  Procedure Laterality Date  . NO PAST SURGERIES      Home Medications:  Allergies as of 10/02/2019      Reactions   Shellfish Allergy Itching, Hives, Nausea Only      Medication List       Accurate as of October 02, 2019  1:18 PM. If you have any questions, ask your nurse or doctor.        IXEKIZUMAB [place] Inject into the skin every 30 (thirty) days.   lisinopril 20 MG tablet Commonly known as: ZESTRIL Take by mouth.   tamsulosin 0.4 MG Caps capsule Commonly known as: Flomax Take 1 capsule (0.4 mg total) by mouth daily.       Allergies:  Allergies  Allergen Reactions  . Shellfish Allergy Itching, Hives and Nausea Only    Family History: No family history on file.  Social History:  reports that he has never smoked. He has never used smokeless tobacco. He reports previous alcohol use. No history on file for drug.   Physical Exam: BP  (!) 152/83   Pulse (!) 106   Ht 6' (1.829 m)   Wt 205 lb (93 kg)   BMI 27.80 kg/m   Constitutional:  Alert and oriented, No acute distress. HEENT: [place] AT, moist mucus membranes.  Trachea midline, no masses. Cardiovascular: No clubbing, cyanosis, or edema. Skin: No rashes, bruises or suspicious lesions. Neurologic: Grossly intact, no focal deficits, moving all 4 extremities. Psychiatric: Normal mood and affect.   Assessment & Plan:    1. Prostate cancer ([place]) Eligard x16-month Depo given today  Upcoming radiation treatments-we discussed my anticipate involvement with his brachytherapy boost portion of his care.  Reminded of calcium/vitamin D recommendations for bone health   Return in about 6 months (around 03/31/2020) for Eligard/PSA.  Hollice Espy, MD  [facility] [address] [place], [place] [address] [phone]

## 2019-10-03 ENCOUNTER — Telehealth: Payer: Self-pay | Admitting: *Deleted

## 2019-10-03 LAB — PSA: Prostate Specific Ag, Serum: 16 ng/mL — ABNORMAL HIGH (ref 0.0–4.0)

## 2019-10-03 NOTE — Telephone Encounter (Addendum)
  Left patient a VM with details, asked to return call with any questions.   ----- Message from Hollice Espy, MD sent at 10/03/2019  8:38 AM EST ----- PSA is starting to come down nicely as anticipated, down to 16.

## 2019-10-04 ENCOUNTER — Other Ambulatory Visit: Payer: Self-pay | Admitting: *Deleted

## 2019-10-04 DIAGNOSIS — C61 Malignant neoplasm of prostate: Secondary | ICD-10-CM

## 2019-10-08 DIAGNOSIS — C61 Malignant neoplasm of prostate: Secondary | ICD-10-CM | POA: Diagnosis not present

## 2019-10-09 ENCOUNTER — Ambulatory Visit
Admission: RE | Admit: 2019-10-09 | Discharge: 2019-10-09 | Disposition: A | Discharge status: Home / Self Care | Payer: 59 | Source: Ambulatory Visit | Attending: Radiation Oncology | Admitting: Radiation Oncology

## 2019-10-09 ENCOUNTER — Other Ambulatory Visit: Payer: Self-pay | Admitting: Radiology

## 2019-10-09 ENCOUNTER — Other Ambulatory Visit: Payer: Self-pay

## 2019-10-09 DIAGNOSIS — C61 Malignant neoplasm of prostate: Secondary | ICD-10-CM

## 2019-10-10 ENCOUNTER — Other Ambulatory Visit: Payer: Self-pay

## 2019-10-10 ENCOUNTER — Ambulatory Visit: Admission: RE | Admit: 2019-10-10 | Payer: 59 | Source: Ambulatory Visit

## 2019-10-10 DIAGNOSIS — C61 Malignant neoplasm of prostate: Secondary | ICD-10-CM | POA: Diagnosis not present

## 2019-10-11 ENCOUNTER — Other Ambulatory Visit: Payer: Self-pay

## 2019-10-11 ENCOUNTER — Ambulatory Visit
Admission: RE | Admit: 2019-10-11 | Discharge: 2019-10-11 | Disposition: A | Discharge status: Home / Self Care | Payer: 59 | Source: Ambulatory Visit | Attending: Radiation Oncology | Admitting: Radiation Oncology

## 2019-10-11 DIAGNOSIS — C61 Malignant neoplasm of prostate: Secondary | ICD-10-CM | POA: Diagnosis not present

## 2019-10-12 ENCOUNTER — Ambulatory Visit
Admission: RE | Admit: 2019-10-12 | Discharge: 2019-10-12 | Disposition: A | Discharge status: Home / Self Care | Payer: 59 | Source: Ambulatory Visit | Attending: Radiation Oncology | Admitting: Radiation Oncology

## 2019-10-12 ENCOUNTER — Other Ambulatory Visit: Payer: Self-pay

## 2019-10-12 DIAGNOSIS — C61 Malignant neoplasm of prostate: Secondary | ICD-10-CM | POA: Diagnosis not present

## 2019-10-15 ENCOUNTER — Other Ambulatory Visit: Payer: Self-pay

## 2019-10-15 ENCOUNTER — Ambulatory Visit
Admission: RE | Admit: 2019-10-15 | Discharge: 2019-10-15 | Disposition: A | Discharge status: Home / Self Care | Payer: 59 | Source: Ambulatory Visit | Attending: Radiation Oncology | Admitting: Radiation Oncology

## 2019-10-15 DIAGNOSIS — C61 Malignant neoplasm of prostate: Secondary | ICD-10-CM | POA: Diagnosis not present

## 2019-10-16 ENCOUNTER — Other Ambulatory Visit: Payer: Self-pay

## 2019-10-16 ENCOUNTER — Ambulatory Visit
Admission: RE | Admit: 2019-10-16 | Discharge: 2019-10-16 | Disposition: A | Discharge status: Home / Self Care | Payer: 59 | Source: Ambulatory Visit | Attending: Radiation Oncology | Admitting: Radiation Oncology

## 2019-10-16 DIAGNOSIS — C61 Malignant neoplasm of prostate: Secondary | ICD-10-CM | POA: Diagnosis not present

## 2019-10-17 ENCOUNTER — Other Ambulatory Visit: Payer: Self-pay

## 2019-10-17 ENCOUNTER — Ambulatory Visit
Admission: RE | Admit: 2019-10-17 | Discharge: 2019-10-17 | Disposition: A | Discharge status: Home / Self Care | Payer: 59 | Source: Ambulatory Visit | Attending: Radiation Oncology | Admitting: Radiation Oncology

## 2019-10-17 DIAGNOSIS — C61 Malignant neoplasm of prostate: Secondary | ICD-10-CM | POA: Diagnosis not present

## 2019-10-18 ENCOUNTER — Ambulatory Visit
Admission: RE | Admit: 2019-10-18 | Discharge: 2019-10-18 | Disposition: A | Discharge status: Home / Self Care | Payer: 59 | Source: Ambulatory Visit | Attending: Radiation Oncology | Admitting: Radiation Oncology

## 2019-10-18 ENCOUNTER — Other Ambulatory Visit: Payer: Self-pay

## 2019-10-18 DIAGNOSIS — C61 Malignant neoplasm of prostate: Secondary | ICD-10-CM | POA: Diagnosis not present

## 2019-10-19 ENCOUNTER — Other Ambulatory Visit: Payer: Self-pay

## 2019-10-19 ENCOUNTER — Ambulatory Visit
Admission: RE | Admit: 2019-10-19 | Discharge: 2019-10-19 | Disposition: A | Discharge status: Home / Self Care | Payer: 59 | Source: Ambulatory Visit | Attending: Radiation Oncology | Admitting: Radiation Oncology

## 2019-10-19 DIAGNOSIS — C61 Malignant neoplasm of prostate: Secondary | ICD-10-CM | POA: Diagnosis not present

## 2019-10-22 ENCOUNTER — Other Ambulatory Visit: Payer: Self-pay

## 2019-10-22 ENCOUNTER — Ambulatory Visit
Admission: RE | Admit: 2019-10-22 | Discharge: 2019-10-22 | Disposition: A | Discharge status: Home / Self Care | Payer: 59 | Source: Ambulatory Visit | Attending: Radiation Oncology | Admitting: Radiation Oncology

## 2019-10-22 DIAGNOSIS — C61 Malignant neoplasm of prostate: Secondary | ICD-10-CM | POA: Diagnosis not present

## 2019-10-23 ENCOUNTER — Other Ambulatory Visit: Payer: Self-pay

## 2019-10-23 ENCOUNTER — Ambulatory Visit
Admission: RE | Admit: 2019-10-23 | Discharge: 2019-10-23 | Disposition: A | Discharge status: Home / Self Care | Payer: 59 | Source: Ambulatory Visit | Attending: Radiation Oncology | Admitting: Radiation Oncology

## 2019-10-23 DIAGNOSIS — C61 Malignant neoplasm of prostate: Secondary | ICD-10-CM | POA: Diagnosis not present

## 2019-10-24 ENCOUNTER — Inpatient Hospital Stay: Payer: 59 | Attending: Radiation Oncology

## 2019-10-24 ENCOUNTER — Other Ambulatory Visit: Payer: Self-pay

## 2019-10-24 ENCOUNTER — Ambulatory Visit
Admission: RE | Admit: 2019-10-24 | Discharge: 2019-10-24 | Disposition: A | Discharge status: Home / Self Care | Payer: 59 | Source: Ambulatory Visit | Attending: Radiation Oncology | Admitting: Radiation Oncology

## 2019-10-24 DIAGNOSIS — C61 Malignant neoplasm of prostate: Secondary | ICD-10-CM | POA: Diagnosis not present

## 2019-10-24 LAB — CBC
HCT: 37.7 % — ABNORMAL LOW (ref 39.0–52.0)
Hemoglobin: 13.3 g/dL (ref 13.0–17.0)
MCH: 32.6 pg (ref 26.0–34.0)
MCHC: 35.3 g/dL (ref 30.0–36.0)
MCV: 92.4 fL (ref 80.0–100.0)
Platelets: 201 10*3/uL (ref 150–400)
RBC: 4.08 MIL/uL — ABNORMAL LOW (ref 4.22–5.81)
RDW: 11.9 % (ref 11.5–15.5)
WBC: 5.7 10*3/uL (ref 4.0–10.5)
nRBC: 0 % (ref 0.0–0.2)

## 2019-10-25 ENCOUNTER — Ambulatory Visit
Admission: RE | Admit: 2019-10-25 | Discharge: 2019-10-25 | Disposition: A | Discharge status: Home / Self Care | Payer: 59 | Source: Ambulatory Visit | Attending: Radiation Oncology | Admitting: Radiation Oncology

## 2019-10-25 DIAGNOSIS — C61 Malignant neoplasm of prostate: Secondary | ICD-10-CM | POA: Diagnosis not present

## 2019-10-26 ENCOUNTER — Ambulatory Visit
Admission: RE | Admit: 2019-10-26 | Discharge: 2019-10-26 | Disposition: A | Discharge status: Home / Self Care | Payer: 59 | Source: Ambulatory Visit | Attending: Radiation Oncology | Admitting: Radiation Oncology

## 2019-10-26 DIAGNOSIS — C61 Malignant neoplasm of prostate: Secondary | ICD-10-CM | POA: Diagnosis not present

## 2019-10-29 ENCOUNTER — Ambulatory Visit
Admission: RE | Admit: 2019-10-29 | Discharge: 2019-10-29 | Disposition: A | Discharge status: Home / Self Care | Payer: 59 | Source: Ambulatory Visit | Attending: Radiation Oncology | Admitting: Radiation Oncology

## 2019-10-29 DIAGNOSIS — C61 Malignant neoplasm of prostate: Secondary | ICD-10-CM | POA: Diagnosis not present

## 2019-10-30 ENCOUNTER — Ambulatory Visit
Admission: RE | Admit: 2019-10-30 | Discharge: 2019-10-30 | Disposition: A | Discharge status: Home / Self Care | Payer: 59 | Source: Ambulatory Visit | Attending: Radiation Oncology | Admitting: Radiation Oncology

## 2019-10-30 DIAGNOSIS — C61 Malignant neoplasm of prostate: Secondary | ICD-10-CM | POA: Diagnosis not present

## 2019-10-31 ENCOUNTER — Ambulatory Visit
Admission: RE | Admit: 2019-10-31 | Discharge: 2019-10-31 | Disposition: A | Discharge status: Home / Self Care | Payer: 59 | Source: Ambulatory Visit | Attending: Radiation Oncology | Admitting: Radiation Oncology

## 2019-10-31 DIAGNOSIS — C61 Malignant neoplasm of prostate: Secondary | ICD-10-CM | POA: Diagnosis not present

## 2019-11-01 ENCOUNTER — Ambulatory Visit
Admission: RE | Admit: 2019-11-01 | Discharge: 2019-11-01 | Disposition: A | Discharge status: Home / Self Care | Payer: 59 | Source: Ambulatory Visit | Attending: Radiation Oncology | Admitting: Radiation Oncology

## 2019-11-01 DIAGNOSIS — C61 Malignant neoplasm of prostate: Secondary | ICD-10-CM | POA: Diagnosis not present

## 2019-11-02 ENCOUNTER — Ambulatory Visit
Admission: RE | Admit: 2019-11-02 | Discharge: 2019-11-02 | Disposition: A | Discharge status: Home / Self Care | Payer: 59 | Source: Ambulatory Visit | Attending: Radiation Oncology | Admitting: Radiation Oncology

## 2019-11-02 DIAGNOSIS — C61 Malignant neoplasm of prostate: Secondary | ICD-10-CM | POA: Diagnosis not present

## 2019-11-05 ENCOUNTER — Ambulatory Visit
Admission: RE | Admit: 2019-11-05 | Discharge: 2019-11-05 | Disposition: A | Discharge status: Home / Self Care | Payer: 59 | Source: Ambulatory Visit | Attending: Radiation Oncology | Admitting: Radiation Oncology

## 2019-11-05 DIAGNOSIS — C61 Malignant neoplasm of prostate: Secondary | ICD-10-CM | POA: Diagnosis not present

## 2019-11-06 ENCOUNTER — Ambulatory Visit
Admission: RE | Admit: 2019-11-06 | Discharge: 2019-11-06 | Disposition: A | Discharge status: Home / Self Care | Payer: 59 | Source: Ambulatory Visit | Attending: Radiation Oncology | Admitting: Radiation Oncology

## 2019-11-06 DIAGNOSIS — C61 Malignant neoplasm of prostate: Secondary | ICD-10-CM | POA: Diagnosis not present

## 2019-11-07 ENCOUNTER — Inpatient Hospital Stay: Payer: 59

## 2019-11-07 ENCOUNTER — Ambulatory Visit
Admission: RE | Admit: 2019-11-07 | Discharge: 2019-11-07 | Disposition: A | Discharge status: Home / Self Care | Payer: 59 | Source: Ambulatory Visit | Attending: Radiation Oncology | Admitting: Radiation Oncology

## 2019-11-07 ENCOUNTER — Other Ambulatory Visit: Payer: Self-pay

## 2019-11-07 DIAGNOSIS — C61 Malignant neoplasm of prostate: Secondary | ICD-10-CM | POA: Diagnosis not present

## 2019-11-07 LAB — CBC
HCT: 35.9 % — ABNORMAL LOW (ref 39.0–52.0)
Hemoglobin: 13.3 g/dL (ref 13.0–17.0)
MCH: 33.5 pg (ref 26.0–34.0)
MCHC: 37 g/dL — ABNORMAL HIGH (ref 30.0–36.0)
MCV: 90.4 fL (ref 80.0–100.0)
Platelets: 184 10*3/uL (ref 150–400)
RBC: 3.97 MIL/uL — ABNORMAL LOW (ref 4.22–5.81)
RDW: 12.5 % (ref 11.5–15.5)
WBC: 5.4 10*3/uL (ref 4.0–10.5)
nRBC: 0 % (ref 0.0–0.2)

## 2019-11-08 ENCOUNTER — Ambulatory Visit
Admission: RE | Admit: 2019-11-08 | Discharge: 2019-11-08 | Disposition: A | Discharge status: Home / Self Care | Payer: 59 | Source: Ambulatory Visit | Attending: Radiation Oncology | Admitting: Radiation Oncology

## 2019-11-08 DIAGNOSIS — C61 Malignant neoplasm of prostate: Secondary | ICD-10-CM | POA: Diagnosis not present

## 2019-11-09 ENCOUNTER — Ambulatory Visit
Admission: RE | Admit: 2019-11-09 | Discharge: 2019-11-09 | Disposition: A | Discharge status: Home / Self Care | Payer: 59 | Source: Ambulatory Visit | Attending: Radiation Oncology | Admitting: Radiation Oncology

## 2019-11-09 DIAGNOSIS — C61 Malignant neoplasm of prostate: Secondary | ICD-10-CM | POA: Diagnosis not present

## 2019-11-12 ENCOUNTER — Other Ambulatory Visit: Payer: Self-pay

## 2019-11-12 ENCOUNTER — Ambulatory Visit
Admission: RE | Admit: 2019-11-12 | Discharge: 2019-11-12 | Disposition: A | Discharge status: Home / Self Care | Payer: 59 | Source: Ambulatory Visit | Attending: Radiation Oncology | Admitting: Radiation Oncology

## 2019-11-12 ENCOUNTER — Other Ambulatory Visit
Admission: RE | Admit: 2019-11-12 | Discharge: 2019-11-12 | Disposition: A | Discharge status: Home / Self Care | Payer: 59 | Source: Ambulatory Visit | Attending: Radiation Oncology | Admitting: Radiation Oncology

## 2019-11-12 DIAGNOSIS — C61 Malignant neoplasm of prostate: Secondary | ICD-10-CM | POA: Diagnosis not present

## 2019-11-12 DIAGNOSIS — Z20822 Contact with and (suspected) exposure to covid-19: Secondary | ICD-10-CM | POA: Diagnosis not present

## 2019-11-12 DIAGNOSIS — Z01812 Encounter for preprocedural laboratory examination: Secondary | ICD-10-CM | POA: Insufficient documentation

## 2019-11-12 LAB — SARS CORONAVIRUS 2 (TAT 6-24 HRS): SARS Coronavirus 2: NEGATIVE

## 2019-11-13 ENCOUNTER — Ambulatory Visit
Admission: RE | Admit: 2019-11-13 | Discharge: 2019-11-13 | Disposition: A | Discharge status: Home / Self Care | Payer: 59 | Source: Ambulatory Visit | Attending: Radiation Oncology | Admitting: Radiation Oncology

## 2019-11-13 DIAGNOSIS — C61 Malignant neoplasm of prostate: Secondary | ICD-10-CM | POA: Diagnosis not present

## 2019-11-13 MED ORDER — CIPROFLOXACIN IN D5W 400 MG/200ML IV SOLN: 400.0000 mg | INTRAVENOUS | Status: DC

## 2019-11-14 ENCOUNTER — Encounter: Payer: Self-pay | Admitting: Radiation Oncology

## 2019-11-14 ENCOUNTER — Encounter: Payer: Self-pay | Admitting: Registered Nurse

## 2019-11-14 ENCOUNTER — Ambulatory Visit: Payer: 59

## 2019-11-14 ENCOUNTER — Ambulatory Visit: Admission: RE | Admit: 2019-11-14 | Payer: 59 | Source: Home / Self Care

## 2019-11-14 ENCOUNTER — Other Ambulatory Visit: Payer: Self-pay

## 2019-11-14 ENCOUNTER — Ambulatory Visit
Admission: RE | Admit: 2019-11-14 | Discharge: 2019-11-14 | Disposition: A | Discharge status: Home / Self Care | Payer: 59 | Source: Ambulatory Visit | Attending: Radiation Oncology | Admitting: Radiation Oncology

## 2019-11-14 VITALS — BP 141/84 | HR 76 | Temp 96.1°F | Resp 18 | Wt 200.9 lb

## 2019-11-14 DIAGNOSIS — C61 Malignant neoplasm of prostate: Secondary | ICD-10-CM | POA: Insufficient documentation

## 2019-11-14 DIAGNOSIS — Z79899 Other long term (current) drug therapy: Secondary | ICD-10-CM | POA: Diagnosis not present

## 2019-11-14 DIAGNOSIS — I10 Essential (primary) hypertension: Secondary | ICD-10-CM | POA: Insufficient documentation

## 2019-11-14 DIAGNOSIS — M129 Arthropathy, unspecified: Secondary | ICD-10-CM | POA: Diagnosis not present

## 2019-11-14 SURGERY — ULTRASOUND, PROSTATE, FOR VOLUME DETERMINATION
Anesthesia: Choice

## 2019-11-14 NOTE — Progress Notes (Signed)
Radiation Oncology Follow up Note  Name: Robert Wells   Date:   10/09/2019 MRN:  [id number] DOB: 1961-06-30    This 59 y.o. male presents to the OR today for volume study in anticipation of an I-125 interstitial implant for adenocarcinoma the prostate  REFERRING PROVIDER: No ref. provider found  HPI:  Patient is a 59 year old male presents with Gleason 8 (4+4) adenocarcinoma the prostate with a PSA of 100.  Bone scan showed no evidence of metastatic disease CT scan showed borderline large right common iliac nodes and enlarged inguinal nodes.  Inguinal node was biopsied and was benign.  He is started on androgen deprivation therapy as well as external beam radiation therapy to his prostate and pelvic nodes.  Seen today for volume study in anticipation of an I-125 interstitial implant for boost.  He is doing well specifically denies any significant increased lower urinary tract symptoms diarrhea or fatigue.  He was taken to the OR today for volume study under ultrasound guidance in anticipation of I-125 interstitial implant for boost.  COMPLICATIONS OF TREATMENT: none  FOLLOW UP COMPLIANCE: keeps appointments   PHYSICAL EXAM:  There were no vitals taken for this visit. Well-developed well-nourished patient in NAD. HEENT reveals PERLA, EOMI, discs not visualized.  Oral cavity is clear. No oral mucosal lesions are identified. Neck is clear without evidence of cervical or supraclavicular adenopathy. Lungs are clear to A&P. Cardiac examination is essentially unremarkable with regular rate and rhythm without murmur rub or thrill. Abdomen is benign with no organomegaly or masses noted. Motor sensory and DTR levels are equal and symmetric in the upper and lower extremities. Cranial nerves II through XII are grossly intact. Proprioception is intact. No peripheral adenopathy or edema is identified. No motor or sensory levels are noted. Crude visual fields are within normal range.  RADIOLOGY RESULTS:  Ultrasound guidance used for volume study  PLAN: Patient was taken to the cystoscopy suite in the OR. Patient was placed in the low lithotomy position. Foley catheter was placed. Trans-rectal ultrasound probe was inserted into the rectum and prostate seminal vesicles were visualized as well as bladder base. stepping images were performed on a 5 mm increments. Images will be placed in Brachy[facility] treatment planning system to determine seed placement coordinates for eventual I-125 interstitial implant. Images will be reviewed with the physics and dosimetry staff for final quality approval. I personally was present for the volume study and assisted in delineation of contour volumes.  At the end of the procedure Foley catheter was removed, rectal ultrasound probe was removed. Patient tolerated his procedures extremely well with no side effects or complaints. Patient has given appointment for interstitial implant date. Consent was signed today as well as history and physical performed in preparation for his outpatient surgical implant.     Noreene Filbert, MD

## 2019-11-14 NOTE — H&P (View-Only) (Signed)
NEW PATIENT EVALUATION  Name: Robert Wells  MRN: [id number]  Date:   11/14/2019     DOB: [birth year]   This 59 y.o. male patient presents to the clinic for history and physical anticipation of I-125 interstitial implant for boost for adenocarcinoma the prostate  REFERRING PHYSICIAN: Baxter Hire, MD  CHIEF COMPLAINT:  Chief Complaint  Patient presents with  . Prostate Cancer    Post volume study    DIAGNOSIS: The encounter diagnosis was Prostate cancer ([place]).   PREVIOUS INVESTIGATIONS:  Clinical notes reviewed Pathology reviewed  HPI: Patient is a 59 year old male presents with Gleason 8 (4+4) adenocarcinoma the prostate with a PSA of 100.  Bone scan showed no evidence of metastatic disease CT scan showed borderline large right common iliac nodes and enlarged inguinal nodes.  Inguinal node was biopsied and was benign.  He is started on androgen deprivation therapy as well as external beam radiation therapy to his prostate and pelvic nodes.  Seen today for volume study in anticipation of an I-125 interstitial implant for boost.  He is doing well specifically denies any significant increased lower urinary tract symptoms diarrhea or fatigue.  PLANNED TREATMENT REGIMEN: I-125 interstitial implant for boost  PAST MEDICAL HISTORY:  has a past medical history of Arthritis, Cancer ([place]), and Hypertension.    PAST SURGICAL HISTORY:  Past Surgical History:  Procedure Laterality Date  . NO PAST SURGERIES      FAMILY HISTORY: family history is not on file.  SOCIAL HISTORY:  reports that he has never smoked. He has never used smokeless tobacco. He reports previous alcohol use.  ALLERGIES: Shellfish allergy  MEDICATIONS:  Current Outpatient Medications  Medication Sig Dispense Refill  . Calcium-Magnesium-Vitamin D (CALCIUM 1200+D3 PO) Take 1 tablet by mouth daily.    Marland Kitchen ibuprofen (ADVIL) 200 MG tablet Take 600-800 mg by mouth 2 (two) times daily as needed for headache or  moderate pain.    . Ixekizumab (TALTZ) 80 MG/ML SOAJ Inject 80 mg into the skin every 30 (thirty) days.    Marland Kitchen lisinopril (ZESTRIL) 20 MG tablet Take 20 mg by mouth daily.     . tamsulosin (FLOMAX) 0.4 MG CAPS capsule Take 1 capsule (0.4 mg total) by mouth daily. 30 capsule 11   No current facility-administered medications for this encounter.   Facility-Administered Medications Ordered in Other Encounters  Medication Dose Route Frequency Provider Last Rate Last Admin  . ciprofloxacin (CIPRO) IVPB 400 mg  400 mg Intravenous 60 min Pre-Op Hollice Espy, MD        ECOG PERFORMANCE STATUS:  0 - Asymptomatic  REVIEW OF SYSTEMS: Patient denies any weight loss, fatigue, weakness, fever, chills or night sweats. Patient denies any loss of vision, blurred vision. Patient denies any ringing  of the ears or hearing loss. No irregular heartbeat. Patient denies heart murmur or history of fainting. Patient denies any chest pain or pain radiating to her upper extremities. Patient denies any shortness of breath, difficulty breathing at night, cough or hemoptysis. Patient denies any swelling in the lower legs. Patient denies any nausea vomiting, vomiting of blood, or coffee ground material in the vomitus. Patient denies any stomach pain. Patient states has had normal bowel movements no significant constipation or diarrhea. Patient denies any dysuria, hematuria or significant nocturia. Patient denies any problems walking, swelling in the joints or loss of balance. Patient denies any skin changes, loss of hair or loss of weight. Patient denies any excessive worrying or anxiety or significant depression.  Patient denies any problems with insomnia. Patient denies excessive thirst, polyuria, polydipsia. Patient denies any swollen glands, patient denies easy bruising or easy bleeding. Patient denies any recent infections, allergies or URI. Patient "s visual fields have not changed significantly in recent time.   PHYSICAL  EXAM: BP (!) 141/84 (BP Location: Left Arm, Patient Position: Sitting)   Pulse 76   Temp (!) 96.1 F (35.6 C) (Tympanic)   Resp 18   Wt 200 lb 14.4 oz (91.1 kg)   BMI 27.25 kg/m  Well-developed well-nourished patient in NAD. HEENT reveals PERLA, EOMI, discs not visualized.  Oral cavity is clear. No oral mucosal lesions are identified. Neck is clear without evidence of cervical or supraclavicular adenopathy. Lungs are clear to A&P. Cardiac examination is essentially unremarkable with regular rate and rhythm without murmur rub or thrill. Abdomen is benign with no organomegaly or masses noted. Motor sensory and DTR levels are equal and symmetric in the upper and lower extremities. Cranial nerves II through XII are grossly intact. Proprioception is intact. No peripheral adenopathy or edema is identified. No motor or sensory levels are noted. Crude visual fields are within normal range.  LABORATORY DATA: Pathology reviewed    RADIOLOGY RESULTS: Ultrasound used for volume study   IMPRESSION: Stage IIb adenocarcinoma the prostate Gleason 8 seen today for volume study in anticipation of I-125 interstitial implant for boost to his prostate  PLAN: Present time patient is cleared to go ahead with I-125 interstitial implant.  Volume study was performed showing excellent imaging.  Treatment planning will be performed now for seed placement.  Risks and benefits of treatment including radiation safety precautions were reviewed with the patient.  He comprehends her treatment plan well.  I would like to take this opportunity to thank you for allowing me to participate in the care of your patient.Noreene Filbert, MD

## 2019-11-14 NOTE — H&P (Signed)
NEW PATIENT EVALUATION  Name: Robert Wells  MRN: [id number]  Date:   11/14/2019     DOB: Apr 10, 1961   This 59 y.o. male patient presents to the clinic for history and physical anticipation of I-125 interstitial implant for boost for adenocarcinoma the prostate  REFERRING PHYSICIAN: Baxter Hire, MD  CHIEF COMPLAINT:  Chief Complaint  Patient presents with  . Prostate Cancer    Post volume study    DIAGNOSIS: The encounter diagnosis was Prostate cancer ([place]).   PREVIOUS INVESTIGATIONS:  Clinical notes reviewed Pathology reviewed  HPI: Patient is a 59 year old male presents with Gleason 8 (4+4) adenocarcinoma the prostate with a PSA of 100.  Bone scan showed no evidence of metastatic disease CT scan showed borderline large right common iliac nodes and enlarged inguinal nodes.  Inguinal node was biopsied and was benign.  He is started on androgen deprivation therapy as well as external beam radiation therapy to his prostate and pelvic nodes.  Seen today for volume study in anticipation of an I-125 interstitial implant for boost.  He is doing well specifically denies any significant increased lower urinary tract symptoms diarrhea or fatigue.  PLANNED TREATMENT REGIMEN: I-125 interstitial implant for boost  PAST MEDICAL HISTORY:  has a past medical history of Arthritis, Cancer ([place]), and Hypertension.    PAST SURGICAL HISTORY:  Past Surgical History:  Procedure Laterality Date  . NO PAST SURGERIES      FAMILY HISTORY: family history is not on file.  SOCIAL HISTORY:  reports that he has never smoked. He has never used smokeless tobacco. He reports previous alcohol use.  ALLERGIES: Shellfish allergy  MEDICATIONS:  Current Outpatient Medications  Medication Sig Dispense Refill  . Calcium-Magnesium-Vitamin D (CALCIUM 1200+D3 PO) Take 1 tablet by mouth daily.    Marland Kitchen ibuprofen (ADVIL) 200 MG tablet Take 600-800 mg by mouth 2 (two) times daily as needed for headache or  moderate pain.    . Ixekizumab (TALTZ) 80 MG/ML SOAJ Inject 80 mg into the skin every 30 (thirty) days.    Marland Kitchen lisinopril (ZESTRIL) 20 MG tablet Take 20 mg by mouth daily.     . tamsulosin (FLOMAX) 0.4 MG CAPS capsule Take 1 capsule (0.4 mg total) by mouth daily. 30 capsule 11   No current facility-administered medications for this encounter.   Facility-Administered Medications Ordered in Other Encounters  Medication Dose Route Frequency Provider Last Rate Last Admin  . ciprofloxacin (CIPRO) IVPB 400 mg  400 mg Intravenous 60 min Pre-Op Hollice Espy, MD        ECOG PERFORMANCE STATUS:  0 - Asymptomatic  REVIEW OF SYSTEMS: Patient denies any weight loss, fatigue, weakness, fever, chills or night sweats. Patient denies any loss of vision, blurred vision. Patient denies any ringing  of the ears or hearing loss. No irregular heartbeat. Patient denies heart murmur or history of fainting. Patient denies any chest pain or pain radiating to her upper extremities. Patient denies any shortness of breath, difficulty breathing at night, cough or hemoptysis. Patient denies any swelling in the lower legs. Patient denies any nausea vomiting, vomiting of blood, or coffee ground material in the vomitus. Patient denies any stomach pain. Patient states has had normal bowel movements no significant constipation or diarrhea. Patient denies any dysuria, hematuria or significant nocturia. Patient denies any problems walking, swelling in the joints or loss of balance. Patient denies any skin changes, loss of hair or loss of weight. Patient denies any excessive worrying or anxiety or significant depression.  Patient denies any problems with insomnia. Patient denies excessive thirst, polyuria, polydipsia. Patient denies any swollen glands, patient denies easy bruising or easy bleeding. Patient denies any recent infections, allergies or URI. Patient "s visual fields have not changed significantly in recent time.   PHYSICAL  EXAM: BP (!) 141/84 (BP Location: Left Arm, Patient Position: Sitting)   Pulse 76   Temp (!) 96.1 F (35.6 C) (Tympanic)   Resp 18   Wt 200 lb 14.4 oz (91.1 kg)   BMI 27.25 kg/m  Well-developed well-nourished patient in NAD. HEENT reveals PERLA, EOMI, discs not visualized.  Oral cavity is clear. No oral mucosal lesions are identified. Neck is clear without evidence of cervical or supraclavicular adenopathy. Lungs are clear to A&P. Cardiac examination is essentially unremarkable with regular rate and rhythm without murmur rub or thrill. Abdomen is benign with no organomegaly or masses noted. Motor sensory and DTR levels are equal and symmetric in the upper and lower extremities. Cranial nerves II through XII are grossly intact. Proprioception is intact. No peripheral adenopathy or edema is identified. No motor or sensory levels are noted. Crude visual fields are within normal range.  LABORATORY DATA: Pathology reviewed    RADIOLOGY RESULTS: Ultrasound used for volume study   IMPRESSION: Stage IIb adenocarcinoma the prostate Gleason 8 seen today for volume study in anticipation of I-125 interstitial implant for boost to his prostate  PLAN: Present time patient is cleared to go ahead with I-125 interstitial implant.  Volume study was performed showing excellent imaging.  Treatment planning will be performed now for seed placement.  Risks and benefits of treatment including radiation safety precautions were reviewed with the patient.  He comprehends her treatment plan well.  I would like to take this opportunity to thank you for allowing me to participate in the care of your patient.Noreene Filbert, MD

## 2019-11-19 DIAGNOSIS — C61 Malignant neoplasm of prostate: Secondary | ICD-10-CM | POA: Insufficient documentation

## 2019-11-23 ENCOUNTER — Encounter
Admission: RE | Admit: 2019-11-23 | Discharge: 2019-11-23 | Disposition: A | Discharge status: Home / Self Care | Payer: 59 | Source: Ambulatory Visit | Attending: Urology | Admitting: Urology

## 2019-11-23 ENCOUNTER — Other Ambulatory Visit: Payer: Self-pay

## 2019-11-23 NOTE — Patient Instructions (Signed)
Your procedure is scheduled on: Monday December 03, 2019 Report to Day Surgery on the 2nd floor of the [facility]. To find out your arrival time, please call [phone] between 1PM - 3PM on: Friday November 30, 2019  REMEMBER: Instructions that are not followed completely may result in serious medical risk, up to and including death; or upon the discretion of your surgeon and anesthesiologist your surgery may need to be rescheduled.  Do not eat food after midnight the night before surgery.  No gum chewing, lozengers or hard candies.  You may however, drink CLEAR liquids up to 2 hours before you are scheduled to arrive for your surgery. Do not drink anything within 2 hours of the start of your surgery.  Clear liquids include: - water  - apple juice without pulp - gatorade (not RED) - black coffee or tea (Do NOT add milk or creamers to the coffee or tea) Do NOT drink anything that is not on this list.  TAKE THESE MEDICATIONS THE MORNING OF SURGERY WITH A SIP OF WATER: tamsulosin  DO NOT TAKE LISINOPRIL MORNING  OF SURGERY Follow recommendations from Cardiologist, Pulmonologist or PCP regarding stopping Aspirin  Stop Anti-inflammatories (NSAIDS) such as Advil, Aleve, Ibuprofen, Motrin, Naproxen, Naprosyn and Aspirin based products such as Excedrin, Goodys Powder, BC Powder. (May take Tylenol or Acetaminophen if needed.)  Stop ANY OVER THE COUNTER supplements until after surgery. (May continue Vitamin D, Vitamin B, and multivitamin.)  No Alcohol for 24 hours before or after surgery.  No Smoking including e-cigarettes for 24 hours prior to surgery.  No chewable tobacco products for at least 6 hours prior to surgery.  No nicotine patches on the day of surgery.  On the morning of surgery brush your teeth with toothpaste and water, you may rinse your mouth with mouthwash if you wish. Do not swallow any toothpaste or mouthwash.  Do not wear jewelry, make-up, hairpins, clips or nail  polish.  Do not wear lotions, powders, or perfumes   Do not shave 48 hours prior to surgery.   Contact lenses, hearing aids and dentures may not be worn into surgery.  Do not bring valuables to the hospital, including drivers license, insurance or credit cards.  [facility] is not responsible for any belongings or valuables.   Fleets enema as directed - use  Morning of surgery.  Notify your doctor if there is any change in your medical condition (cold, fever, infection).  Wear comfortable clothing (specific to your surgery type) to the hospital.  If you are being discharged the day of surgery, you will not be allowed to drive home. You will need a responsible adult to drive you home and stay with you that night.   If you are taking public transportation, you will need to have a responsible adult with you. Please confirm with your physician that it is acceptable to use public transportation.   Please call [phone] if you have any questions about these instructions.  Visitation Policy:  Patients undergoing a surgery or procedure in a hospital may have one family member or support person with them as long as that person is not COVID-19 positive or experiencing its symptoms. That person may remain in the waiting area during the procedure. Should the patient need to stay at the hospital during part of their recovery, the support person may visit during visiting hours; 7 am to 8 pm.  Children under 58 years of age may have both parents or legal guardians  with them during their hospital stay.   Inpatient Visitation Update:  Two designated support people may visit a patient during visiting hours 7 am to 8 pm. It must be the same two designated people for the duration of the patient stay. The visitors may come and go during the day, and there is no switching out to have different visitors. A mask must be worn at all times, including in the patient room.

## 2019-11-26 ENCOUNTER — Other Ambulatory Visit: Payer: Self-pay

## 2019-11-26 ENCOUNTER — Encounter
Admission: RE | Admit: 2019-11-26 | Discharge: 2019-11-26 | Disposition: A | Discharge status: Home / Self Care | Payer: 59 | Source: Ambulatory Visit | Attending: Urology | Admitting: Urology

## 2019-11-26 DIAGNOSIS — I10 Essential (primary) hypertension: Secondary | ICD-10-CM | POA: Insufficient documentation

## 2019-11-26 DIAGNOSIS — Z01818 Encounter for other preprocedural examination: Secondary | ICD-10-CM | POA: Insufficient documentation

## 2019-11-26 LAB — CBC
HCT: 36 % — ABNORMAL LOW (ref 39.0–52.0)
Hemoglobin: 13.2 g/dL (ref 13.0–17.0)
MCH: 34.1 pg — ABNORMAL HIGH (ref 26.0–34.0)
MCHC: 36.7 g/dL — ABNORMAL HIGH (ref 30.0–36.0)
MCV: 93 fL (ref 80.0–100.0)
Platelets: 227 10*3/uL (ref 150–400)
RBC: 3.87 MIL/uL — ABNORMAL LOW (ref 4.22–5.81)
RDW: 14 % (ref 11.5–15.5)
WBC: 5 10*3/uL (ref 4.0–10.5)
nRBC: 0 % (ref 0.0–0.2)

## 2019-11-29 ENCOUNTER — Other Ambulatory Visit
Admission: RE | Admit: 2019-11-29 | Discharge: 2019-11-29 | Disposition: A | Discharge status: Home / Self Care | Payer: 59 | Source: Ambulatory Visit | Attending: Urology | Admitting: Urology

## 2019-11-29 ENCOUNTER — Inpatient Hospital Stay: Admission: RE | Admit: 2019-11-29 | Payer: 59 | Source: Ambulatory Visit

## 2019-11-29 ENCOUNTER — Other Ambulatory Visit: Payer: Self-pay

## 2019-11-29 DIAGNOSIS — Z01812 Encounter for preprocedural laboratory examination: Secondary | ICD-10-CM | POA: Diagnosis not present

## 2019-11-29 DIAGNOSIS — Z20822 Contact with and (suspected) exposure to covid-19: Secondary | ICD-10-CM | POA: Diagnosis not present

## 2019-11-29 LAB — SARS CORONAVIRUS 2 (TAT 6-24 HRS): SARS Coronavirus 2: NEGATIVE

## 2019-12-03 ENCOUNTER — Encounter: Payer: Self-pay | Admitting: Urology

## 2019-12-03 ENCOUNTER — Ambulatory Visit
Admission: RE | Admit: 2019-12-03 | Discharge: 2019-12-03 | Disposition: A | Discharge status: Home / Self Care | Payer: 59 | Source: Ambulatory Visit | Attending: Urology | Admitting: Urology

## 2019-12-03 ENCOUNTER — Ambulatory Visit: Payer: 59 | Admitting: Anesthesiology

## 2019-12-03 ENCOUNTER — Ambulatory Visit: Payer: 59

## 2019-12-03 ENCOUNTER — Encounter
Admission: RE | Disposition: A | Discharge status: Home / Self Care | Payer: Self-pay | Source: Ambulatory Visit | Attending: Urology

## 2019-12-03 ENCOUNTER — Ambulatory Visit: Payer: 59 | Attending: Radiation Oncology

## 2019-12-03 DIAGNOSIS — Z79899 Other long term (current) drug therapy: Secondary | ICD-10-CM | POA: Insufficient documentation

## 2019-12-03 DIAGNOSIS — C61 Malignant neoplasm of prostate: Secondary | ICD-10-CM | POA: Insufficient documentation

## 2019-12-03 DIAGNOSIS — M1990 Unspecified osteoarthritis, unspecified site: Secondary | ICD-10-CM | POA: Insufficient documentation

## 2019-12-03 DIAGNOSIS — I10 Essential (primary) hypertension: Secondary | ICD-10-CM | POA: Insufficient documentation

## 2019-12-03 HISTORY — PX: CYSTOSCOPY: SHX5120

## 2019-12-03 HISTORY — PX: RADIOACTIVE SEED IMPLANT: SHX5150

## 2019-12-03 SURGERY — INSERTION, RADIATION SOURCE, PROSTATE
Anesthesia: General | Site: Prostate

## 2019-12-03 MED ORDER — DEXAMETHASONE SODIUM PHOSPHATE 10 MG/ML IJ SOLN
INTRAMUSCULAR | Status: AC
  Filled 2019-12-03: qty 1

## 2019-12-03 MED ORDER — LACTATED RINGERS IV SOLN: INTRAVENOUS | Status: DC

## 2019-12-03 MED ORDER — FAMOTIDINE 20 MG PO TABS: 20.0000 mg | ORAL_TABLET | Freq: Once | ORAL | Status: AC

## 2019-12-03 MED ORDER — CIPROFLOXACIN HCL 500 MG PO TABS: 500.0000 mg | ORAL_TABLET | Freq: Two times a day (BID) | ORAL | 0 refills | Status: DC

## 2019-12-03 MED ORDER — FENTANYL CITRATE (PF) 100 MCG/2ML IJ SOLN: 25.0000 ug | INTRAMUSCULAR | Status: DC | PRN

## 2019-12-03 MED ORDER — MIDAZOLAM HCL 2 MG/2ML IJ SOLN
INTRAMUSCULAR | Status: AC
  Filled 2019-12-03: qty 2

## 2019-12-03 MED ORDER — DEXAMETHASONE SODIUM PHOSPHATE 10 MG/ML IJ SOLN
INTRAMUSCULAR | Status: DC | PRN
  Administered 2019-12-03: 10 mg via INTRAVENOUS

## 2019-12-03 MED ORDER — FLEET ENEMA 7-19 GM/118ML RE ENEM
1.0000 | ENEMA | Freq: Once | RECTAL | Status: AC
  Administered 2019-12-03: 1 via RECTAL

## 2019-12-03 MED ORDER — HYDROCODONE-ACETAMINOPHEN 5-325 MG PO TABS: 1.0000 | ORAL_TABLET | Freq: Four times a day (QID) | ORAL | 0 refills | Status: DC | PRN

## 2019-12-03 MED ORDER — CIPROFLOXACIN IN D5W 400 MG/200ML IV SOLN
INTRAVENOUS | Status: AC
  Filled 2019-12-03: qty 200

## 2019-12-03 MED ORDER — PROPOFOL 10 MG/ML IV BOLUS
INTRAVENOUS | Status: DC | PRN
  Administered 2019-12-03: 150 mg via INTRAVENOUS

## 2019-12-03 MED ORDER — LIDOCAINE HCL (CARDIAC) PF 100 MG/5ML IV SOSY
PREFILLED_SYRINGE | INTRAVENOUS | Status: DC | PRN
  Administered 2019-12-03: 80 mg via INTRAVENOUS

## 2019-12-03 MED ORDER — FENTANYL CITRATE (PF) 100 MCG/2ML IJ SOLN
INTRAMUSCULAR | Status: DC | PRN
  Administered 2019-12-03: 25 ug via INTRAVENOUS
  Administered 2019-12-03: 50 ug via INTRAVENOUS
  Administered 2019-12-03: 25 ug via INTRAVENOUS

## 2019-12-03 MED ORDER — ROCURONIUM BROMIDE 100 MG/10ML IV SOLN
INTRAVENOUS | Status: DC | PRN
  Administered 2019-12-03: 50 mg via INTRAVENOUS

## 2019-12-03 MED ORDER — ROCURONIUM BROMIDE 10 MG/ML (PF) SYRINGE
PREFILLED_SYRINGE | INTRAVENOUS | Status: AC
  Filled 2019-12-03: qty 10

## 2019-12-03 MED ORDER — FENTANYL CITRATE (PF) 100 MCG/2ML IJ SOLN
INTRAMUSCULAR | Status: AC
  Filled 2019-12-03: qty 2

## 2019-12-03 MED ORDER — FAMOTIDINE 20 MG PO TABS
ORAL_TABLET | ORAL | Status: AC
  Administered 2019-12-03: 20 mg via ORAL
  Filled 2019-12-03: qty 1

## 2019-12-03 MED ORDER — SEVOFLURANE IN SOLN
RESPIRATORY_TRACT | Status: AC
  Filled 2019-12-03: qty 250

## 2019-12-03 MED ORDER — MIDAZOLAM HCL 2 MG/2ML IJ SOLN
INTRAMUSCULAR | Status: DC | PRN
  Administered 2019-12-03: 2 mg via INTRAVENOUS

## 2019-12-03 MED ORDER — BACITRACIN ZINC 500 UNIT/GM EX OINT
TOPICAL_OINTMENT | CUTANEOUS | Status: AC
  Filled 2019-12-03: qty 28.35

## 2019-12-03 MED ORDER — DEXMEDETOMIDINE HCL IN NACL 200 MCG/50ML IV SOLN
INTRAVENOUS | Status: DC | PRN
  Administered 2019-12-03: 4 ug via INTRAVENOUS

## 2019-12-03 MED ORDER — BACITRACIN 500 UNIT/GM EX OINT
TOPICAL_OINTMENT | CUTANEOUS | Status: DC | PRN
  Administered 2019-12-03: 1 via TOPICAL

## 2019-12-03 MED ORDER — CIPROFLOXACIN IN D5W 400 MG/200ML IV SOLN
400.0000 mg | Freq: Once | INTRAVENOUS | Status: AC
  Administered 2019-12-03: 400 mg via INTRAVENOUS

## 2019-12-03 MED ORDER — ONDANSETRON HCL 4 MG/2ML IJ SOLN
INTRAMUSCULAR | Status: AC
  Filled 2019-12-03: qty 2

## 2019-12-03 MED ORDER — ONDANSETRON HCL 4 MG/2ML IJ SOLN: 4.0000 mg | Freq: Once | INTRAMUSCULAR | Status: DC | PRN

## 2019-12-03 MED ORDER — ONDANSETRON HCL 4 MG/2ML IJ SOLN
INTRAMUSCULAR | Status: DC | PRN
  Administered 2019-12-03: 4 mg via INTRAVENOUS

## 2019-12-03 MED ORDER — PROPOFOL 10 MG/ML IV BOLUS
INTRAVENOUS | Status: AC
  Filled 2019-12-03: qty 40

## 2019-12-03 SURGICAL SUPPLY — 22 items
BAG URINE DRAIN 2000ML AR STRL (UROLOGICAL SUPPLIES) ×3 IMPLANT
BLADE CLIPPER SURG (BLADE) ×3 IMPLANT
CATH FOL 2WAY LX 16X5 (CATHETERS) ×3 IMPLANT
COVER BACK TABLE REUSABLE LG (DRAPES) ×3 IMPLANT
DRAPE INCISE 23X17 IOBAN STRL (DRAPES) ×1
DRAPE INCISE IOBAN 23X17 STRL (DRAPES) ×2 IMPLANT
DRAPE UNDER BUTTOCK W/FLU (DRAPES) ×3 IMPLANT
DRSG TELFA 3X8 NADH (GAUZE/BANDAGES/DRESSINGS) ×3 IMPLANT
GLOVE BIO SURGEON STRL SZ 6.5 (GLOVE) ×6 IMPLANT
GLOVE BIO SURGEON STRL SZ7.5 (GLOVE) ×6 IMPLANT
GOWN STRL REUS W/ TWL LRG LVL3 (GOWN DISPOSABLE) ×4 IMPLANT
GOWN STRL REUS W/ TWL XL LVL3 (GOWN DISPOSABLE) ×2 IMPLANT
GOWN STRL REUS W/TWL LRG LVL3 (GOWN DISPOSABLE) ×2
GOWN STRL REUS W/TWL XL LVL3 (GOWN DISPOSABLE) ×1
IV NS 1000ML (IV SOLUTION) ×1
IV NS 1000ML BAXH (IV SOLUTION) ×2 IMPLANT
KIT TURNOVER CYSTO (KITS) ×3 IMPLANT
PACK CYSTO AR (MISCELLANEOUS) ×3 IMPLANT
SET CYSTO W/LG BORE CLAMP LF (SET/KITS/TRAYS/PACK) ×3 IMPLANT
SURGILUBE 2OZ TUBE FLIPTOP (MISCELLANEOUS) ×3 IMPLANT
SYR 10ML LL (SYRINGE) ×3 IMPLANT
WATER STERILE IRR 1000ML POUR (IV SOLUTION) ×3 IMPLANT

## 2019-12-03 NOTE — Progress Notes (Signed)
Patient was able to urinate 75 mls and I bladder scanned him and he had 15 ml in his bladder and he stated he did not feel full anymore and he did not feel like he had to urinate. I will d/c the pt.

## 2019-12-03 NOTE — Op Note (Signed)
12/03/19  Preoperative diagnosis: Adenocarcinoma of the prostate   Postoperative diagnosis: Same   Procedure: I-125 prostate seed implantation, cystoscopy  Surgeon: Hollice Espy M.D. , Lavena Stanford, M.D.   Anesthesia: General  Drains: none  Complications: none  Indications: high risk prostate cancer, see H&P  Procedure: Patient was brought to operating suite and placement table in the supine position. At this time, a universal timeout protocol was performed, all team members were identified, Venodyne boots are placed, and he was administered IV Ancef in the preoperative period. He was placed in lithotomy position and prepped and draped in usual manner. Radiation oncology department placed a transrectal ultrasound probe anchoring stand/ grid and aligned with previous imaging from the volume study. Foley catheter was inserted without difficulty.  All needle passage was done with real-time transrectal ultrasound guidance in both the transverse and sagittal plains in order to achieve the desired preplanned position. A total of 23 needles were placed.  71 active seeds were implanted. The Foley catheter was removed and a rigid cystoscopy failed to show any seeds outside the prostate without evidence of trauma to the urethral, prostatic fossa, or bladder.  The bladder was drained.  A fluoroscopic image was then obtained showing excellent distrubution of the brachytherapy seeds.  Each seed was counted and counts were correct.    The patient was then repositioned in the supine position, reversed from anesthesia, and taken to the PACU in stable condition.

## 2019-12-03 NOTE — Interval H&P Note (Signed)
History and Physical Interval Note:  12/03/2019 7:28 AM  Robert Wells  has presented today for surgery, with the diagnosis of prostate cancer.  The various methods of treatment have been discussed with the patient and family. After consideration of risks, benefits and other options for treatment, the patient has consented to  Procedure(s): RADIOACTIVE SEED IMPLANT/BRACHYTHERAPY IMPLANT (N/A) as a surgical intervention.  The patient's history has been reviewed, patient examined, no change in status, stable for surgery.  I have reviewed the patient's chart and labs.  Questions were answered to the patient's satisfaction.    RRR CTAB  Hollice Espy

## 2019-12-03 NOTE — Discharge Instructions (Addendum)
Brachytherapy for Prostate Cancer, Care After  This sheet gives you information about how to care for yourself after your procedure. Your health care provider may also give you more specific instructions. If you have problems or questions, contact your health care provider. What can I expect after the procedure? After the procedure, it is common to have:  Trouble passing urine.  Blood in the urine or semen.  Constipation.  Frequent feeling of an urgent need to urinate.  Bruising, swelling, and tenderness of the area behind the scrotum (perineum).  Bloating and gas.  Fatigue.  Burning or pain in the rectum.  Problems getting or keeping an erection (erectile dysfunction).  Nausea. Follow these instructions at home: Managing pain, stiffness, and swelling  If directed, apply ice to the affected area: ? Put ice in a plastic bag. ? Place a towel between your skin and the bag. ? Leave the ice on for 20 minutes, 2-3 times a day.  Try not to sit directly on the area behind the scrotum. A soft cushion can help with discomfort. Activity  Do not drive for 24 hours if you were given a medicine to help you relax (sedative).  Do not drive or use heavy machinery while taking prescription pain medicine.  Rest as told by your health care provider.  Most people can return to normal activities a few days or weeks after the procedure. Ask your health care provider what activities are safe for you. Eating and drinking  Drink enough fluid to keep your urine clear or pale yellow.  Eat a healthy, balanced diet. This includes lean proteins, whole grains, and plenty of fruits and vegetables. General instructions  Take over-the-counter and prescription medicines only as told by your health care provider.  Keep all follow-up visits as told by your health care provider. This is important. You may still need additional treatment.  Do not take baths, swim, or use a hot tub until your health  care provider approves. Shower and wash the area behind the scrotum gently.  Do not have sex for one week after the treatment, or until your health care provider approves.  If you have permanent, low-dose brachytherapy implants: ? Limit close contact with children and pregnant women for 2 months or as told by your health care provider. This is important because of the radiation that is still active in the prostate. ? You may set off radioactive sensors, such as airport screenings. Ask your health care provider for a document that explains your treatment. ? You may be instructed to use a condom during sex for the first 2 months after low-dose brachytherapy. Contact a health care provider if:  You have a fever or chills.  You do not have a bowel movement for 3-4 days after the procedure.  You have diarrhea for 3-4 days after the procedure.  You develop any new symptoms, such as problems with urinating or erectile dysfunction.  You have abdomen (abdominal) pain.  You have more blood in your urine. Get help right away if:  You cannot urinate.  There is excessive bleeding from your rectum.  You have unusual drainage coming from your rectum.  You have severe pain in the treated area that does not go away with pain medicine.  You have severe nausea or vomiting. Summary  If you have permanent, low-dose brachytherapy implants, limit close contact with children and pregnant women for 2 months or as told by your health care provider. This is important because of the  radiation that is still active in the prostate.  Talk with your health care provider about your risk of brachytherapy side effects, such as erectile dysfunction or urinary problems. Your health care provider will be able to recommend possible treatment options.  Keep all follow-up visits as told by your health care provider. This is important. You may need additional treatment. This information is not intended to replace  advice given to you by your health care provider. Make sure you discuss any questions you have with your health care provider. Document Revised: 07/15/2017 Document Reviewed: 09/03/2016 Elsevier Patient Education  2020 [place]   1) The drugs that you were given will stay in your system until tomorrow so for the next 24 hours you should not:  A) Drive an automobile B) Make any legal decisions C) Drink any alcoholic beverage   2) You may resume regular meals tomorrow.  Today it is better to start with liquids and gradually work up to solid foods.  You may eat anything you prefer, but it is better to start with liquids, then soup and crackers, and gradually work up to solid foods.   3) Please notify your doctor immediately if you have any unusual bleeding, trouble breathing, redness and pain at the surgery site, drainage, fever, or pain not relieved by medication.    4) Additional Instructions:        Please contact your physician with any problems or Same Day Surgery at [phone], Monday through Friday 6 am to 4 pm, or [place] at [facility] number at [phone].

## 2019-12-03 NOTE — Anesthesia Preprocedure Evaluation (Signed)
Anesthesia Evaluation  Patient identified by MRN, date of birth, ID band Patient awake    Reviewed: Allergy & Precautions, NPO status , Patient's Chart, lab work & pertinent test results  History of Anesthesia Complications Negative for: history of anesthetic complications  Airway Mallampati: II       Dental   Pulmonary neg sleep apnea, neg COPD, Not current smoker,           Cardiovascular hypertension, Pt. on medications (-) Past MI and (-) CHF (-) dysrhythmias (-) Valvular Problems/Murmurs     Neuro/Psych neg Seizures    GI/Hepatic Neg liver ROS, neg GERD  ,  Endo/Other  neg diabetes  Renal/GU negative Renal ROS     Musculoskeletal   Abdominal   Peds  Hematology   Anesthesia Other Findings   Reproductive/Obstetrics                             Anesthesia Physical Anesthesia Plan  ASA: II  Anesthesia Plan: General   Post-op Pain Management:    Induction: Intravenous  PONV Risk Score and Plan: 2 and Ondansetron and Dexamethasone  Airway Management Planned: Oral ETT  Additional Equipment:   Intra-op Plan:   Post-operative Plan:   Informed Consent: I have reviewed the patients History and Physical, chart, labs and discussed the procedure including the risks, benefits and alternatives for the proposed anesthesia with the patient or authorized representative who has indicated his/her understanding and acceptance.       Plan Discussed with:   Anesthesia Plan Comments:         Anesthesia Quick Evaluation  

## 2019-12-03 NOTE — Transfer of Care (Signed)
Immediate Anesthesia Transfer of Care Note  Patient: Robert Wells  Procedure(s) Performed: RADIOACTIVE SEED IMPLANT/BRACHYTHERAPY IMPLANT (N/A Prostate) CYSTOSCOPY (N/A Bladder)  Patient Location: PACU  Anesthesia Type:General  Level of Consciousness: drowsy  Airway & Oxygen Therapy: Patient Spontanous Breathing and Patient connected to face mask oxygen  Post-op Assessment: Report given to RN and Post -op Vital signs reviewed and stable  Post vital signs: Reviewed and stable  Last Vitals:  Vitals Value Taken Time  BP 118/73 12/03/19 0902  Temp    Pulse 83 12/03/19 0903  Resp 13 12/03/19 0903  SpO2 99 % 12/03/19 0903  Vitals shown include unvalidated device data.  Last Pain:  Vitals:   12/03/19 0618  TempSrc:   PainSc: 0-No pain         Complications: No apparent anesthesia complications

## 2019-12-03 NOTE — Progress Notes (Signed)
Radiation Oncology I-125 interstitial implant note  Name: Robert Wells   Date:   10/09/2019 MRN:  [id number] DOB: 05/21/1961    This 59 y.o. male presents to the I-125 interstitial implant for stage IIb adenocarcinoma the prostate Gleason 8 (4+4) presenting with a PSA of 100 status post IMRT radiation therapy to his prostate and pelvic nodes  REFERRING PROVIDER: No ref. provider found  HPI: Patient is a 59 year old male presented with a Gleason 8 (4+4) adenocarcinoma the prostate with a PSA of 100.  Bone scan and CT scans showed no evidence of metastatic disease he did have borderline large right common iliac nodes.  He completed external beam radiation therapy which she tolerated extremely well he was taken to the OR today for I-125 interstitial implant..  COMPLICATIONS OF TREATMENT: none  FOLLOW UP COMPLIANCE: keeps appointments   PHYSICAL EXAM:  BP 126/76   Pulse 82   Temp (!) 97.2 F (36.2 C) (Temporal)   Resp 18   SpO2 100%  Well-developed well-nourished patient in NAD. HEENT reveals PERLA, EOMI, discs not visualized.  Oral cavity is clear. No oral mucosal lesions are identified. Neck is clear without evidence of cervical or supraclavicular adenopathy. Lungs are clear to A&P. Cardiac examination is essentially unremarkable with regular rate and rhythm without murmur rub or thrill. Abdomen is benign with no organomegaly or masses noted. Motor sensory and DTR levels are equal and symmetric in the upper and lower extremities. Cranial nerves II through XII are grossly intact. Proprioception is intact. No peripheral adenopathy or edema is identified. No motor or sensory levels are noted. Crude visual fields are within normal range.  RADIOLOGY RESULTS: Ultrasound used for seed placement  PLAN: Patient was taken to the operating room and general anesthesia was administered. Legs were immobilized in stirrups and patient was positioned in the exact same proportions as original volume  study. Patient was prepped and Foley catheter was placed. Ultrasound guidance identified the prostate and recreated the original set up as per treatment planning volume study. 27 needles were placed under ultrasound guidance with PVCs delivered to the prostate volume. After completion of procedure cystoscopy was performed by urology and no evidence of seeds in the bladder were noted. Patient tolerated the procedure extremely well. Initial plain film as doublecheck identified 71 seeds in the prostate. Patient has followup appointment in one month for CT scan for quality assurance will be performed.    Noreene Filbert, MD

## 2019-12-03 NOTE — Anesthesia Postprocedure Evaluation (Signed)
Anesthesia Post Note  Patient: Robert Wells  Procedure(s) Performed: RADIOACTIVE SEED IMPLANT/BRACHYTHERAPY IMPLANT (N/A Prostate) CYSTOSCOPY (N/A Bladder)  Patient location during evaluation: PACU Anesthesia Type: General Level of consciousness: awake and alert Pain management: pain level controlled Vital Signs Assessment: post-procedure vital signs reviewed and stable Respiratory status: spontaneous breathing and respiratory function stable Cardiovascular status: stable Anesthetic complications: no     Last Vitals:  Vitals:   12/03/19 0933 12/03/19 0939  BP: (!) 142/95 122/84  Pulse: 87 81  Resp: 19 18  Temp:  (!) 36.2 C  SpO2: 98% 100%    Last Pain:  Vitals:   12/03/19 0939  TempSrc: Temporal  PainSc: 0-No pain                 Ronnett Pullin K

## 2019-12-03 NOTE — Anesthesia Procedure Notes (Signed)
Procedure Name: Intubation Date/Time: 12/03/2019 7:59 AM Performed by: Gentry Fitz, CRNA Pre-anesthesia Checklist: Patient identified, Emergency Drugs available, Suction available and Patient being monitored Patient Re-evaluated:Patient Re-evaluated prior to induction Oxygen Delivery Method: Circle system utilized Preoxygenation: Pre-oxygenation with 100% oxygen Induction Type: IV induction Ventilation: Mask ventilation without difficulty Laryngoscope Size: McGraph and 4 Grade View: Grade II Tube type: Oral Tube size: 7.5 mm Number of attempts: 2 Airway Equipment and Method: Stylet Placement Confirmation: ETT inserted through vocal cords under direct vision,  positive ETCO2 and breath sounds checked- equal and bilateral Secured at: 21 cm Tube secured with: Tape Dental Injury: Teeth and Oropharynx as per pre-operative assessment  Difficulty Due To: Difficult Airway- due to anterior larynx

## 2019-12-31 ENCOUNTER — Ambulatory Visit
Admission: RE | Admit: 2019-12-31 | Discharge: 2019-12-31 | Disposition: A | Discharge status: Home / Self Care | Payer: 59 | Source: Ambulatory Visit | Attending: Radiation Oncology | Admitting: Radiation Oncology

## 2019-12-31 ENCOUNTER — Encounter: Payer: Self-pay | Admitting: Radiation Oncology

## 2019-12-31 ENCOUNTER — Other Ambulatory Visit: Payer: Self-pay | Admitting: *Deleted

## 2019-12-31 ENCOUNTER — Other Ambulatory Visit: Payer: Self-pay

## 2019-12-31 VITALS — BP 150/87 | HR 75 | Temp 96.1°F | Resp 16 | Wt 208.7 lb

## 2019-12-31 DIAGNOSIS — Z923 Personal history of irradiation: Secondary | ICD-10-CM | POA: Diagnosis not present

## 2019-12-31 DIAGNOSIS — C61 Malignant neoplasm of prostate: Secondary | ICD-10-CM | POA: Insufficient documentation

## 2019-12-31 DIAGNOSIS — L409 Psoriasis, unspecified: Secondary | ICD-10-CM | POA: Insufficient documentation

## 2019-12-31 DIAGNOSIS — R0781 Pleurodynia: Secondary | ICD-10-CM | POA: Insufficient documentation

## 2019-12-31 DIAGNOSIS — I10 Essential (primary) hypertension: Secondary | ICD-10-CM | POA: Insufficient documentation

## 2019-12-31 NOTE — Progress Notes (Signed)
Radiation Oncology Follow up Note  Name: Robert Wells   Date:   12/31/2019 MRN:  [id number] DOB: 03-Jun-1961    This 59 y.o. male presents to the clinic today for 1 month follow-up status post I-125 interstitial implant for boost in patient with Gleason 8 (4+4) adenocarcinoma the prostate presenting with a PSA of 100.  REFERRING PROVIDER: Baxter Hire, MD  HPI: Patient is a 59 year old male status post external beam IMRT radiation therapy to his prostate and pelvic nodes plus an I-125 interstitial implant for boost for stage IIb adenocarcinoma the prostate Gleason 8 (4+4) with a PSA over 100.  He is seen today in routine follow-up he is doing well he still states he has significant frequency urgency and nocturia.  He is also having some right lateral rib pain of unknown etiology..  His energy level is good he is having no diarrhea.  COMPLICATIONS OF TREATMENT: none  FOLLOW UP COMPLIANCE: keeps appointments   PHYSICAL EXAM:  BP (!) 150/87 (BP Location: Left Arm, Patient Position: Sitting, Cuff Size: Normal)   Pulse 75   Temp (!) 96.1 F (35.6 C) (Tympanic)   Resp 16   Wt 208 lb 11.2 oz (94.7 kg)   BMI 28.30 kg/m  Well-developed well-nourished patient in NAD. HEENT reveals PERLA, EOMI, discs not visualized.  Oral cavity is clear. No oral mucosal lesions are identified. Neck is clear without evidence of cervical or supraclavicular adenopathy. Lungs are clear to A&P. Cardiac examination is essentially unremarkable with regular rate and rhythm without murmur rub or thrill. Abdomen is benign with no organomegaly or masses noted. Motor sensory and DTR levels are equal and symmetric in the upper and lower extremities. Cranial nerves II through XII are grossly intact. Proprioception is intact. No peripheral adenopathy or edema is identified. No motor or sensory levels are noted. Crude visual fields are within normal range.  RADIOLOGY RESULTS: CT scan for quality assurance reviewed for  placement of sources  PLAN: Present time patient is doing well anticipated side effects since he is currently under treatment with active I-125 seeds.  I have assured over the next month after radioactivity is dissipated he should return to normal functional with his lower urinary tract symptoms.  Patient continues on ADT suppression.  I have asked to see him back in 3 to 4 months with a PSA at that time.  Not sure of his rib pain but will keep an eye on that.  Patient continues close follow-up care with urology.  I would like to take this opportunity to thank you for allowing me to participate in the care of your patient.Noreene Filbert, MD

## 2020-01-01 DIAGNOSIS — C61 Malignant neoplasm of prostate: Secondary | ICD-10-CM | POA: Diagnosis present

## 2020-01-01 NOTE — Progress Notes (Signed)
01/02/20 3:18 PM   Robert Wells 06-07-1961 [id number]  Referring provider: Baxter Hire, MD [place],  [place] [address] Chief Complaint  Patient presents with  . Post-op Follow-up    HPI: Robert Wells is a 59 y.o. M w/ prostate cancer who returns today for 4 week f/u sp brachytherapy boost implant.   He initially presented with a PSA of around 100.  Prostate biopsy revealed high risk Gleason 4+4 disease.  Staging indicated borderline distal right common iliac lymph nodes measuring up to 9 mm which did not reach true pathologic criteria.  He also had bilateral inguinal lymphadenopathy status post recent biopsy not consistent with prostate cancer  He received his first [place] injection on 08/2019 and ADT on 10/02/19  He has been tolerating this really well.  Underwent IMRT w/ brachytherapy boost on 12/03/19.   PSA 16 as of 10/02/19 down from 99.2 from 07/04/2019.   He reports of exacerbation of urinary symptoms on some days compared to others. He reports of urinary hesitancy, weak urinary stream, intermittent stream and nocturia x 5. He states of Flomax wearing off.   He reports of increased tiredness after 5th week of radiation. He does not have as much energy or strength as he used to.  He is not sexually active currently and has not had an erection in a while. He reports of decreased libido.   IPSS    Row Name 01/02/20 1300         International Prostate Symptom Score   How often have you had the sensation of not emptying your bladder?  Less than 1 in 5     How often have you had to urinate less than every two hours?  Less than 1 in 5 times     How often have you found you stopped and started again several times when you urinated?  More than half the time     How often have you found it difficult to postpone urination?  More than half the time     How often have you had a weak urinary stream?  Almost always     How often have you had to strain  to start urination?  Almost always     How many times did you typically get up at night to urinate?  5 Times     Total IPSS Score  25       Quality of Life due to urinary symptoms   If you were to spend the rest of your life with your urinary condition just the way it is now how would you feel about that?  Mostly Disatisfied        Score:  1-7 Mild 8-19 Moderate 20-35 Severe  PMH: Past Medical History:  Diagnosis Date  . Arthritis   . Cancer ([place])   . Hypertension     Surgical History: Past Surgical History:  Procedure Laterality Date  . CYSTOSCOPY N/A 12/03/2019   Procedure: CYSTOSCOPY;  Surgeon: Hollice Espy, MD;  Location: [facility] ORS;  Service: Urology;  Laterality: N/A;  . NO PAST SURGERIES    . RADIOACTIVE SEED IMPLANT N/A 12/03/2019   Procedure: RADIOACTIVE SEED IMPLANT/BRACHYTHERAPY IMPLANT;  Surgeon: Hollice Espy, MD;  Location: [facility] ORS;  Service: Urology;  Laterality: N/A;    Home Medications:  Allergies as of 01/02/2020      Reactions   Shellfish Allergy Itching, Hives, Nausea Only      Medication List  Accurate as of Jan 02, 2020 11:59 PM. If you have any questions, ask your nurse or doctor.        STOP taking these medications   ciprofloxacin 500 MG tablet Commonly known as: Cipro Stopped by: Hollice Espy, MD   HYDROcodone-acetaminophen 5-325 MG tablet Commonly known as: NORCO/VICODIN Stopped by: Hollice Espy, MD     TAKE these medications   CALCIUM 1200+D3 PO Take 1 tablet by mouth daily.   ibuprofen 200 MG tablet Commonly known as: ADVIL Take 600-800 mg by mouth 2 (two) times daily as needed for headache or moderate pain.   lisinopril 20 MG tablet Commonly known as: ZESTRIL Take 20 mg by mouth daily.   meloxicam 15 MG tablet Commonly known as: MOBIC Take 1 tablet (15 mg total) by mouth daily. Started by: Hollice Espy, MD   Taltz 80 MG/ML Soaj Generic drug: Ixekizumab Inject 80 mg into the skin every 30 (thirty)  days.   tamsulosin 0.4 MG Caps capsule Commonly known as: Flomax Take 1 capsule (0.4 mg total) by mouth daily. What changed: Another medication with the same name was added. Make sure you understand how and when to take each. Changed by: Hollice Espy, MD   tamsulosin 0.4 MG Caps capsule Commonly known as: Flomax Take 1 capsule (0.4 mg total) by mouth in the morning and at bedtime. What changed: You were already taking a medication with the same name, and this prescription was added. Make sure you understand how and when to take each. Changed by: Hollice Espy, MD       Allergies:  Allergies  Allergen Reactions  . Shellfish Allergy Itching, Hives and Nausea Only    Family History: No family history on file.  Social History:  reports that he has never smoked. He has never used smokeless tobacco. He reports previous alcohol use. He reports that he does not use drugs.   Physical Exam: BP (!) 144/81   Pulse 83   Constitutional:  Alert and oriented, No acute distress. HEENT: [place] AT, moist mucus membranes.  Trachea midline, no masses. Cardiovascular: No clubbing, cyanosis, or edema. Respiratory: Normal respiratory effort, no increased work of breathing. Skin: No rashes, bruises or suspicious lesions. Neurologic: Grossly intact, no focal deficits, moving all 4 extremities. Psychiatric: Normal mood and affect.  Laboratory Data:  Urinalysis Negative   Pertinent Imaging: Results for orders placed or performed in visit on 01/02/20  Microscopic Examination   URINE  Result Value Ref Range   WBC, UA 0-5 0 - 5 /hpf   RBC None seen 0 - 2 /hpf   Epithelial Cells (non renal) 0-10 0 - 10 /hpf   Bacteria, UA None seen None seen/Few  Urinalysis, Complete  Result Value Ref Range   Specific Gravity, UA 1.025 1.005 - 1.030   pH, UA 5.5 5.0 - 7.5   Color, UA Yellow Yellow   Appearance Ur Clear Clear   Leukocytes,UA Negative Negative   Protein,UA Negative Negative/Trace    Glucose, UA Negative Negative   Ketones, UA Negative Negative   RBC, UA Negative Negative   Bilirubin, UA Negative Negative   Urobilinogen, Ur 1.0 0.2 - 1.0 mg/dL   Nitrite, UA Negative Negative   Microscopic Examination See below:   BLADDER SCAN AMB NON-IMAGING  Result Value Ref Range   Scan Result 34 ML    Assessment & Plan:    1. Prostate cancer  S/p IMRT w/ brachytherapy boost on 12/03/19  Discussed importance of bone health on ADT,  recommend 1000-1200 mg daily calcium suppliment and [phone] IU vit D daily.  Also encouraged weight being exercises and cardiovascular health.  Return in July for Lupron and will obtain PSA at that time   2. Erectile dysfunction Declined Viagara   3. Dysuria UA negative  Rx of Meloxicam for 1 month sent to pharmacy, advised to take either Ibuprofen or Meloxicam but not together for perineal pain  4. Urgency/frequency Continue Flomax Rx of Meloxicam sent to pharmacy for prostatic inflammation  [facility] [address], [place], [place] [address] [phone]  I, Lucas Mallow, am acting as a scribe for Dr. Hollice Espy,  I have reviewed the above documentation for accuracy and completeness, and I agree with the above.   Hollice Espy, MD

## 2020-01-02 ENCOUNTER — Other Ambulatory Visit: Payer: Self-pay

## 2020-01-02 ENCOUNTER — Ambulatory Visit (INDEPENDENT_AMBULATORY_CARE_PROVIDER_SITE_OTHER): Payer: 59 | Admitting: Urology

## 2020-01-02 VITALS — BP 144/81 | HR 83

## 2020-01-02 DIAGNOSIS — R102 Pelvic and perineal pain: Secondary | ICD-10-CM | POA: Diagnosis not present

## 2020-01-02 DIAGNOSIS — C61 Malignant neoplasm of prostate: Secondary | ICD-10-CM | POA: Diagnosis not present

## 2020-01-02 DIAGNOSIS — N522 Drug-induced erectile dysfunction: Secondary | ICD-10-CM | POA: Diagnosis not present

## 2020-01-02 DIAGNOSIS — R300 Dysuria: Secondary | ICD-10-CM

## 2020-01-02 DIAGNOSIS — R350 Frequency of micturition: Secondary | ICD-10-CM

## 2020-01-02 LAB — BLADDER SCAN AMB NON-IMAGING: Scan Result: 34

## 2020-01-02 MED ORDER — TAMSULOSIN HCL 0.4 MG PO CAPS: 0.4000 mg | ORAL_CAPSULE | Freq: Two times a day (BID) | ORAL | 3 refills | Status: DC

## 2020-01-02 MED ORDER — MELOXICAM 15 MG PO TABS: 15.0000 mg | ORAL_TABLET | Freq: Every day | ORAL | 0 refills | Status: DC

## 2020-01-03 LAB — URINALYSIS, COMPLETE
Bilirubin, UA: NEGATIVE
Glucose, UA: NEGATIVE
Ketones, UA: NEGATIVE
Leukocytes,UA: NEGATIVE
Nitrite, UA: NEGATIVE
Protein,UA: NEGATIVE
RBC, UA: NEGATIVE
Specific Gravity, UA: 1.025 (ref 1.005–1.030)
Urobilinogen, Ur: 1 mg/dL (ref 0.2–1.0)
pH, UA: 5.5 (ref 5.0–7.5)

## 2020-01-03 LAB — MICROSCOPIC EXAMINATION
Bacteria, UA: NONE SEEN
RBC, Urine: NONE SEEN /hpf (ref 0–2)

## 2020-01-08 DIAGNOSIS — C61 Malignant neoplasm of prostate: Secondary | ICD-10-CM | POA: Diagnosis not present

## 2020-02-11 ENCOUNTER — Telehealth: Payer: Self-pay

## 2020-02-11 DIAGNOSIS — C61 Malignant neoplasm of prostate: Secondary | ICD-10-CM

## 2020-02-11 NOTE — Progress Notes (Signed)
Medical Necessity Letter

## 2020-02-11 NOTE — Telephone Encounter (Signed)
Survivorship Care Plan visit completed.  Treatment summary reviewed and mailed to patient.  ASCO answers booklet reviewed and mailed to patient.  CARE program and Cancer Transitions discussed with patient along with other resources cancer center offers to patients and caregivers.  Patient verbalized understanding.  SCP packet mailed.  Patient in agreement to see APP  to introduce them to the Survivorship Clinic.  Encouraged patient to call for any questions or concerns.

## 2020-02-11 NOTE — Telephone Encounter (Signed)
T/C to pt to discuss SCP visit and review treatment summary.   No answer but left message asking for return call.   Waiting for return call.

## 2020-03-06 ENCOUNTER — Ambulatory Visit: Payer: 59

## 2020-03-08 ENCOUNTER — Encounter: Payer: Self-pay | Admitting: Radiation Oncology

## 2020-03-24 ENCOUNTER — Inpatient Hospital Stay: Payer: 59 | Attending: Radiation Oncology

## 2020-03-24 ENCOUNTER — Inpatient Hospital Stay (HOSPITAL_BASED_OUTPATIENT_CLINIC_OR_DEPARTMENT_OTHER): Payer: 59 | Admitting: Oncology

## 2020-03-24 ENCOUNTER — Encounter: Payer: Self-pay | Admitting: Oncology

## 2020-03-24 ENCOUNTER — Other Ambulatory Visit: Payer: Self-pay

## 2020-03-24 VITALS — BP 127/72 | HR 70 | Temp 98.1°F | Resp 16 | Ht 72.0 in | Wt 212.8 lb

## 2020-03-24 DIAGNOSIS — Z79899 Other long term (current) drug therapy: Secondary | ICD-10-CM | POA: Diagnosis not present

## 2020-03-24 DIAGNOSIS — R351 Nocturia: Secondary | ICD-10-CM | POA: Diagnosis not present

## 2020-03-24 DIAGNOSIS — M1990 Unspecified osteoarthritis, unspecified site: Secondary | ICD-10-CM | POA: Insufficient documentation

## 2020-03-24 DIAGNOSIS — R5382 Chronic fatigue, unspecified: Secondary | ICD-10-CM | POA: Insufficient documentation

## 2020-03-24 DIAGNOSIS — R300 Dysuria: Secondary | ICD-10-CM | POA: Diagnosis not present

## 2020-03-24 DIAGNOSIS — I10 Essential (primary) hypertension: Secondary | ICD-10-CM | POA: Diagnosis not present

## 2020-03-24 DIAGNOSIS — C61 Malignant neoplasm of prostate: Secondary | ICD-10-CM

## 2020-03-24 LAB — PSA: Prostatic Specific Antigen: 0.34 ng/mL (ref 0.00–4.00)

## 2020-03-24 NOTE — Progress Notes (Signed)
Pt here for survivorship info. Pt has prostate cancer and he has worse urination and pain in night time. Usually up 4 times a night. Has arthritis of hands, feet and back and on inj. Every month and takes ibuprofen for pain. Rates pain 7 today . He does have loose stools 2 times a week. It is usually caused by eating salads or fresh fruit and vegetables. He eats well.

## 2020-03-24 NOTE — Progress Notes (Signed)
Survivorship Clinic Consult Note [facility]  Telephone:([phone] [phone]  CLINIC:  Survivorship  REASON FOR VISIT: Continued surveillance visit for patient diagnosed with prostate cancer s/p brachytherapy with boost implant who presents to Survivorship Clinic for review of [place] and to discuss late and long term effects of treatment.   BRIEF ONCOLOGIC HISTORY:  Oncology History   No history exists.    INTERVAL HISTORY:  Patient presents to the survivorship clinic today for initial meeting to review her survivorship care plan detailing his treatment course for prostate cancer, as well as monitoring long-term side effects of that treatment, education regarding health maintenance, screening, and overall wellness and health promotion.  Overall, he reports feeling well.  Has fairly stable chronic fatigue and nocturia.  He has intermittent discomfort when urinating.  He is followed by Dr. Erlene Quan.  Currently on Flomax twice daily.  REVIEW OF SYSTEMS:  Review of Systems  Constitutional: Positive for malaise/fatigue. Negative for chills, fever and weight loss.  HENT: Negative for congestion, ear pain and tinnitus.   Eyes: Negative.  Negative for blurred vision and double vision.  Respiratory: Negative.  Negative for cough, sputum production and shortness of breath.   Cardiovascular: Negative.  Negative for chest pain, palpitations and leg swelling.  Gastrointestinal: Negative.  Negative for abdominal pain, constipation, diarrhea, nausea and vomiting.  Genitourinary: Positive for dysuria and frequency. Negative for urgency.  Musculoskeletal: Negative for back pain and falls.  Skin: Negative.  Negative for rash.  Neurological: Negative.  Negative for weakness and headaches.  Endo/Heme/Allergies: Negative.  Does not bruise/bleed easily.  Psychiatric/Behavioral: Negative.  Negative for depression. The patient is not nervous/anxious and  does not have insomnia.      ONCOLOGY TREATMENT TEAM:  1. Surgeon:  Dr. Erlene Quan 2. Radiation Oncologist: Dr. Baruch Gouty 3. Urologist: Dr. Erlene Quan   PAST MEDICAL/SURGICAL HISTORY:  Past Medical History:  Diagnosis Date  . Arthritis   . Cancer ([place])   . Hypertension   . Prostate cancer ([place]) 08/21/2019   Past Surgical History:  Procedure Laterality Date  . CYSTOSCOPY N/A 12/03/2019   Procedure: CYSTOSCOPY;  Surgeon: Hollice Espy, MD;  Location: [facility] ORS;  Service: Urology;  Laterality: N/A;  . NO PAST SURGERIES    . RADIOACTIVE SEED IMPLANT N/A 12/03/2019   Procedure: RADIOACTIVE SEED IMPLANT/BRACHYTHERAPY IMPLANT;  Surgeon: Hollice Espy, MD;  Location: [facility] ORS;  Service: Urology;  Laterality: N/A;    SOCIAL HISTORY:  None  ALLERGIES:  Allergies  Allergen Reactions  . Shellfish Allergy Itching, Hives and Nausea Only    CURRENT MEDICATIONS:  Outpatient Encounter Medications as of 03/24/2020  Medication Sig  . Calcium-Magnesium-Vitamin D (CALCIUM 1200+D3 PO) Take 1 tablet by mouth daily.  Marland Kitchen ibuprofen (ADVIL) 200 MG tablet Take 600-800 mg by mouth 2 (two) times daily as needed for headache or moderate pain.  . Ixekizumab (TALTZ) 80 MG/ML SOAJ Inject 80 mg into the skin every 30 (thirty) days.  Marland Kitchen lisinopril (ZESTRIL) 20 MG tablet Take 20 mg by mouth daily.   . tamsulosin (FLOMAX) 0.4 MG CAPS capsule Take 1 capsule (0.4 mg total) by mouth in the morning and at bedtime.  . [DISCONTINUED] meloxicam (MOBIC) 15 MG tablet Take 1 tablet (15 mg total) by mouth daily.  . [DISCONTINUED] tamsulosin (FLOMAX) 0.4 MG CAPS capsule Take 1 capsule (0.4 mg total) by mouth daily.   Facility-Administered Encounter Medications as of 03/24/2020  Medication  . ciprofloxacin (CIPRO) IVPB 400 mg    ONCOLOGIC  FAMILY HISTORY:  No family history on file.  GENETIC COUNSELING/TESTING: None  PHYSICAL EXAMINATION:  Vital Signs:   Vitals:   03/24/20 0914  BP: 127/72  Pulse: 70  Resp: 16  Temp:  98.1 F (36.7 C)   Filed Weights   03/24/20 0914  Weight: 212 lb 12.8 oz (96.5 kg)    General: Well-nourished, well-appearing. No acute distress.  HEENT: Head is normocephalic.  Pupils equal and reactive to light. Conjunctivae clear without exudate.  Sclerae anicteric. Oral mucosa is pink, moist.  Oropharynx is pink without lesions or erythema.  Lymph: No cervical, supraclavicular, or infraclavicular lymphadenopathy noted on palpation.  Cardiovascular: Regular rate and rhythm.Marland Kitchen Respiratory: Clear to auscultation bilaterally. Chest expansion symmetric; breathing non-labored.  GI: Abdomen soft and round; non-tender, non-distended. Bowel sounds normoactive.  GU: Deferred.  Neuro: No focal deficits. Steady gait.  Psych: Mood and affect normal and appropriate for situation.  Extremities: No edema, cyanosis, or clubbing Skin: Warm and dry  LABORATORY DATA:  Lab Results  Component Value Date   WBC 5.0 11/26/2019   HGB 13.2 11/26/2019   HCT 36.0 (L) 11/26/2019   MCV 93.0 11/26/2019   PLT 227 11/26/2019     Chemistry   No results found for: NA, K, CL, CO2, BUN, CREATININE, GLU No results found for: CALCIUM, ALKPHOS, AST, ALT, BILITOT   DIAGNOSTIC IMAGING:  Bone Scan-07/23/2019  IMPRESSION: No definite scintigraphic evidence of osseous metastases.  ASSESSMENT & PLAN:  Mr. Fini is a pleasant 59 y.o. male with history of prostate cancer Gleason 4+4 disease.  PSA pretreatment was 99.2.  PSA today is 16.  He received [place] injection on 08/2019 and ADT on 10/02/2019 he will receive monthly ADT treatment.  Had IMRT with brachytherapy boost on 12/03/2019.  Had 25 radiation treatments from 10/10/19-11/13/19.He presents to the Survivorship clinic for our initial meeting and routine follow-up since completing treatment. We reviewed his survivorship care plan and addressed any acute survivorship concerns since completing treatment.   1. History of Gleason 4+4 disease prostate cancer: Mr. Soulliere is  continuing to recover from definitive treatment for cervical cancer.  Today, he received a copy of his comprehensive survivorship care plan (SCP) which was reviewed with in detail.  The SCP details cancer diagnosis, treatment course, potential late/long-term effects of treatments appropriate follow-up care with recommendations for future, and patient education resources.  A copy of this summary, along with a letter will be sent to the patient's primary care provider via mail/in basket after today's visit. We discussed that once you have had prostate cancer we encourage patients to continue to follow-up with your health care team to continue to monitor help and manage any effects of treatment.  We discussed that there is a chance for the cancer can come back.  The vast majority of recurrences will be detected in the 2-3 years after treatment.    The recommended surveillance for follow-up after diagnosis was reviewed in detail but depending on risk, for patients who received a) initial definitive therapy this includes a PSA every 6-12 months for 5 years then annually thereafter, digital rectal exam every year but may be omitted if PSA is undetectable.  PSA as frequently as every 3 months may be necessary to clarify disease status particularly in high risk men. B) For those patients that were N1 on ADT or localized on observation surveillance recommendation includes physical exam and PSA every 3-6 months with imaging for symptoms or increasing PSA.  The most useful tools for detecting  recurrence include a thorough evaluation of symptoms and a physical exam and monitoring of PSA levels. Imaging may be considered based on symptoms or examination findings.  Blood work may be performed at these visits for if there are symptoms or exam findings concerning for recurrence.  Patient was advised that if he feels that something is not right she should schedule an appointment to be evaluated.  Symptoms discussed in detail  including changes in urination or painful urination, blood in the urine, blood in the semen, bone pain, losing weight without trying, erectile dysfunction  Mr. Delacruz will return to the survivorship clinic as needed; He will return to the [place] [facility] for surveillance visit with medical-oncology, urology, and radiation-oncology. He was made aware to notify a member of his care team if he develops signs or symptoms of recurrence or complications of treatments.   Survivorship Care Survey: Survivorship Care Survey completed post-treatment as recommended by the NCCN Survivorship Guidelines. Patient's answers were reviewed. Based on these, as well as the type of cancer and treatment he underwent, the following Survivorship Topics were discussed:    Painful urination-followed by Dr. Erlene Quan.  Has follow-up this Thursday.  #. Port-a-Cath: Patient had port placed for administration of chemotherapy. It remains in place at this time. Discussed the need to flush the port every 6 to 8 weeks. We also discussed that her medical oncologist will likely consider the removal of the port when she is 1 year post treatment.  Patient verbalizes understanding and agrees to comply with port flush schedule.  #. Smoking cessation/Tobacco Avoidance: I commended Mr. Dutter continued efforts to remain tobacco-free.  We discussed that one of the most important risk reduction strategies in preventing cancer recurrence is smoking cessation.  Patient is committed to abstaining from tobacco.  #. Cancer screening/Health & Wellness Promotion:  Due to Mr. Lammert history and age, he should receive screening for skin cancers and colon cancer. The information and recommendations are listed on the patient's comprehensive care plan/treatment summary and were reviewed in detail with the patient.  I encouraged him to speak with his PCP about arranging these and performing annual wellness exams, as appropriate.     Colorectal cancer-for the average risk patient, screening for colon cancer is recommended beginning at age 67. For people ages 23 through 61, the decision to be screened should be based on a person's preferences, life expectancy, overall health, and prior screening history. Various screening strategies exist including colonoscopy, sigmoidoscopy, stool testing for blood, etc.  You can discuss these with your primary care provider to determine which option is best for you.  I encouraged his to discuss routine vaccinations (Flu, Pneumonia, and Shingles) with PCP and ensure that he is up to date on them. Education regarding covid 19 vaccine discussed.   #. Physical activity/Healthy eating: Getting adequate physical activity and maintaining a healthy diet as a cancer survivor is important for overall wellness and reduces the risk of cancer recurrence.  We reviewed the "Nutrition Rainbow" handout, as well as the handout "Take Control of Your Health and Reduce Your Cancer Risk" from the [place].  Patient was also encouraged to engage in moderate to vigorous exercise for 30 minutes per day most days of the week. We discussed the CARE program which is offered 2 days a week at [facility] without cost to cancer survivors. At this time he declines referral to the CARE program but should she change her mind, a referral can be placed in  Epic by entering the order 'Referral to CARE' at [facility].  We also discussed the importance and health benefits of maintaining a healthy weight and eating a balanced diet. Mr. Bi was encouraged to consume 5-7 servings of fruits and vegetables per day. We discussed that a healthy BMI is 18.5-24.9 and that maintaining a health weight reduces risk of cancer recurrences.   #. Support services/Counseling: Mr. Korber was seen today in in effort to address both the physical and social concerns of our cancer survivors at [facility]. It is not uncommon for this  period of the patient's cancer care trajectory to be one of many emotions and stressors.  I provided support today through active listening, validation of concerns, and expressive supportive counseling.  Mr. Amores was encouraged to take advantage of our support services programs and support groups to better cope in her new life as a cancer survivor after completing anti-cancer treatment. We specifically discussed the Cancer Transitions program and I encouraged her to participate. We also discussed Counseling Services available to Cancer Survivors which is available through self referral.    Dispo:  - Return to [facility] for follow-up with Urology, Dr. Erlene Quan this Thursday, 03/27/2020. - Return to [facility] for follow-up with Radiation Oncology, Dr. Baruch Gouty, on 03/31/2020. - Return to survivorship clinic as needed; no additional follow-up needed at this time.  -Consider transitioning the patient to long-term survivorship, when clinically appropriate.   A total of 20 minutes was spent in face-to-face care of this patient, with greater than 50% of that time spent in counseling and care coordination.    Rulon Abide, NP, AGNP-C [place] at [facility]   Note: [place], [place], [place] [phone]

## 2020-03-27 ENCOUNTER — Other Ambulatory Visit: Payer: Self-pay

## 2020-03-27 ENCOUNTER — Ambulatory Visit: Payer: Self-pay

## 2020-03-31 ENCOUNTER — Ambulatory Visit
Admission: RE | Admit: 2020-03-31 | Discharge: 2020-03-31 | Disposition: A | Discharge status: Home / Self Care | Payer: 59 | Source: Ambulatory Visit | Attending: Radiation Oncology | Admitting: Radiation Oncology

## 2020-03-31 ENCOUNTER — Other Ambulatory Visit: Payer: Self-pay

## 2020-03-31 VITALS — Wt 217.0 lb

## 2020-03-31 DIAGNOSIS — Z923 Personal history of irradiation: Secondary | ICD-10-CM | POA: Insufficient documentation

## 2020-03-31 DIAGNOSIS — C778 Secondary and unspecified malignant neoplasm of lymph nodes of multiple regions: Secondary | ICD-10-CM | POA: Diagnosis not present

## 2020-03-31 DIAGNOSIS — R197 Diarrhea, unspecified: Secondary | ICD-10-CM | POA: Insufficient documentation

## 2020-03-31 DIAGNOSIS — R351 Nocturia: Secondary | ICD-10-CM | POA: Diagnosis not present

## 2020-03-31 DIAGNOSIS — C61 Malignant neoplasm of prostate: Secondary | ICD-10-CM | POA: Diagnosis not present

## 2020-03-31 NOTE — Progress Notes (Signed)
Radiation Oncology Follow up Note  Name: Robert Wells   Date:   03/31/2020 MRN:  [id number] DOB: 28-Oct-1960    This 60 y.o. male presents to the clinic today for 9-month follow-up status post I-125 interstitial implant for boost after completion of external beam radiation therapy to his prostate and pelvic nodes for Gleason 8 (4+4) adenocarcinoma the prostate presenting with a PSA of 100.Marland Kitchen  REFERRING PROVIDER: Baxter Hire, MD  HPI: Patient is a 59 year old male now out 4 months having completed I-125 interstitial implant for boost as well as external beam radiation therapy to his prostate and pelvic nodes for stage IIb Gleason 8 adenocarcinoma the prostate presenting with a PSA of over 100. Seen today in routine follow-up he is doing fairly well he states he does have nocturia x3-4 which is been present since prior to radiation. O also has occasional intermittent diarrhea. He is having no bone pain at this time. Most recent PSA was 0.34.Marland Kitchen  COMPLICATIONS OF TREATMENT: none  FOLLOW UP COMPLIANCE: keeps appointments   PHYSICAL EXAM:  Wt 217 lb (98.4 kg)   BMI 29.43 kg/m  Well-developed well-nourished patient in NAD. HEENT reveals PERLA, EOMI, discs not visualized.  Oral cavity is clear. No oral mucosal lesions are identified. Neck is clear without evidence of cervical or supraclavicular adenopathy. Lungs are clear to A&P. Cardiac examination is essentially unremarkable with regular rate and rhythm without murmur rub or thrill. Abdomen is benign with no organomegaly or masses noted. Motor sensory and DTR levels are equal and symmetric in the upper and lower extremities. Cranial nerves II through XII are grossly intact. Proprioception is intact. No peripheral adenopathy or edema is identified. No motor or sensory levels are noted. Crude visual fields are within normal range.  RADIOLOGY RESULTS: No current films to review  PLAN: Present time patient is doing well he is currently on ADT  therapy which may account for his excellent PSA response although it is excellent at 0.34. He has no significant increase in lower urinary tract symptoms per from prior to his radiation. Does have some mild intermittent diarrhea. I am pleased with his overall progress I have asked to see him back in 6 months for follow-up with a PSA at that time. Patient knows to call with any concerns.  I would like to take this opportunity to thank you for allowing me to participate in the care of your patient.Noreene Filbert, MD

## 2020-04-03 ENCOUNTER — Encounter: Payer: Self-pay | Admitting: Urology

## 2020-04-08 ENCOUNTER — Telehealth: Payer: Self-pay

## 2020-04-08 NOTE — Telephone Encounter (Signed)
Called [employer] for Pa on Eligard. PA approved 04/08/20- 8/24-22. REF #[id number]. Pt scheduled

## 2020-04-11 ENCOUNTER — Other Ambulatory Visit: Payer: Self-pay

## 2020-04-11 ENCOUNTER — Ambulatory Visit (INDEPENDENT_AMBULATORY_CARE_PROVIDER_SITE_OTHER): Payer: 59

## 2020-04-11 DIAGNOSIS — C61 Malignant neoplasm of prostate: Secondary | ICD-10-CM | POA: Diagnosis not present

## 2020-04-11 MED ORDER — LEUPROLIDE ACETATE (6 MONTH) 45 MG ~~LOC~~ KIT
45.0000 mg | PACK | Freq: Once | SUBCUTANEOUS | Status: AC
  Administered 2020-04-11: 45 mg via SUBCUTANEOUS

## 2020-04-11 NOTE — Progress Notes (Signed)
Eligard SubQ Injection   Due to Prostate Cancer patient is present today for a Eligard Injection.  Medication: Eligard 6 month Dose: 45 mg  Location: right upper abdomen Lot: [id number] Exp: 09/2021  Patient tolerated well, no complications were noted  Performed by: Bradly Bienenstock, CMA   Patient's next follow up was scheduled for 10/07/20. This appointment was scheduled using wheel and given to patient today along with reminder continue on Vitamin D 800-1000iu and Calium 1000-1200mg  daily while on Androgen Deprivation Therapy.  PA approval dates: PA approved 04/08/20- 8/24-22

## 2020-05-13 IMAGING — CT CT ABD-PELV W/ CM
2 of 6 series · 16 of 46 positions shown, 18 images · IV contrast (omnipaque)
Comparison: None.

CLINICAL DATA: Prostate cancer.

EXAM:
CT ABDOMEN AND PELVIS WITH CONTRAST
TECHNIQUE: Multidetector CT imaging of the abdomen and pelvis was performed
using the standard protocol following bolus administration of
intravenous contrast.
CONTRAST:  100mL OMNIPAQUE IOHEXOL 300 MG/ML  SOLN

[Series 2: routine abd/pel with · axial · 0.69mm/px · z∈[-518,-93]mm · 13 of 97 slices shown, 15 images]
[im 6/97  soft-tissue]
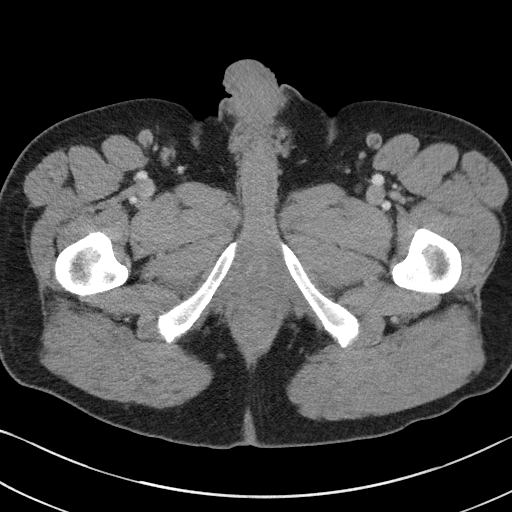
[im 6/97  bone]
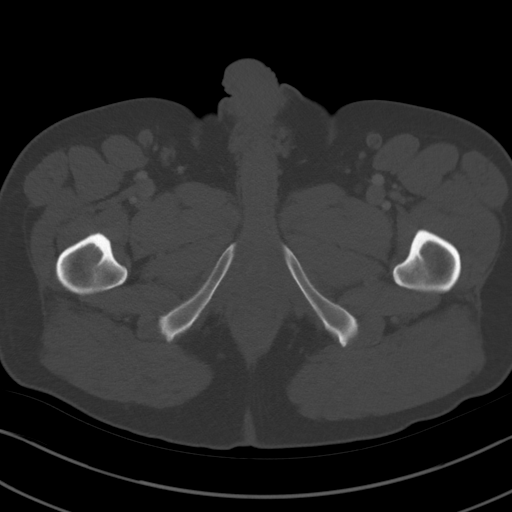
[im 12/97  soft-tissue]
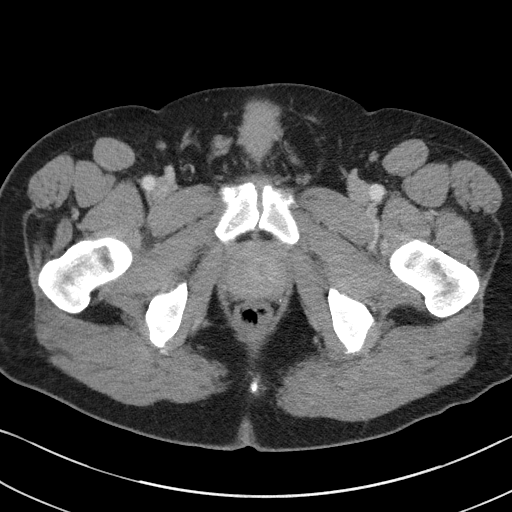
[im 23/97  soft-tissue]
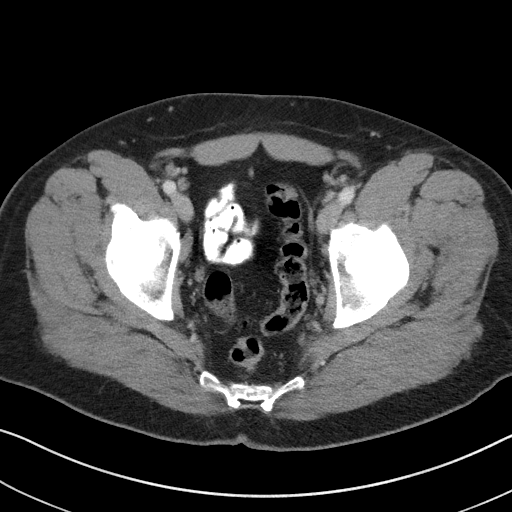
[im 29/97  soft-tissue]
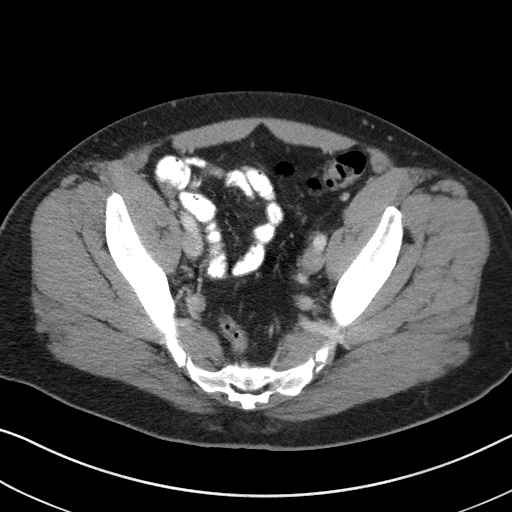
[im 34/97  soft-tissue]
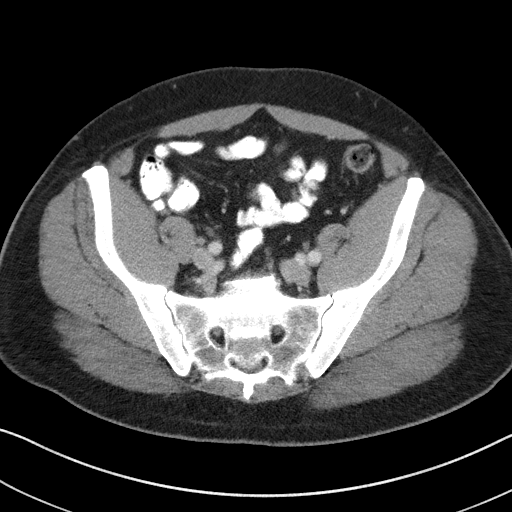
[im 40/97  soft-tissue]
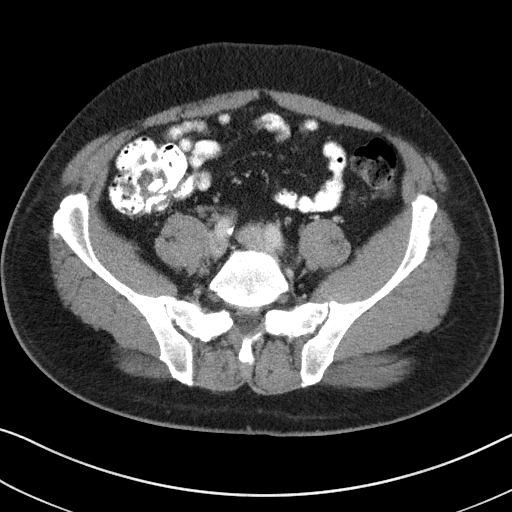
[im 51/97  soft-tissue]
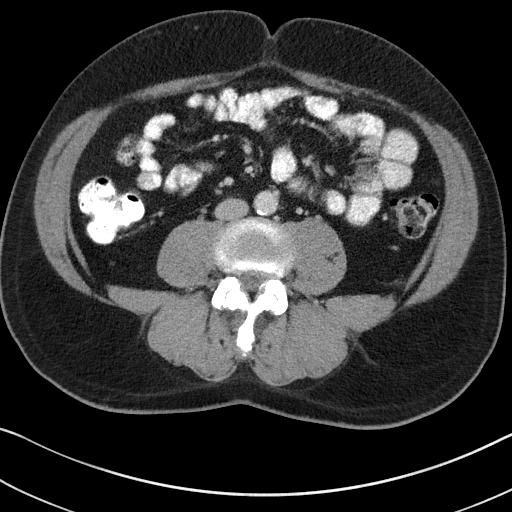
[im 57/97  soft-tissue]
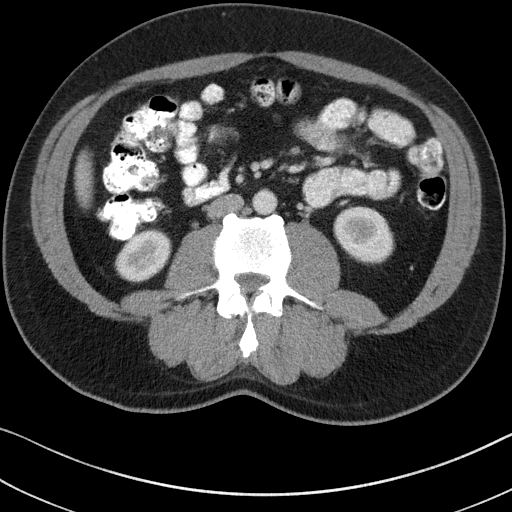
[im 63/97  soft-tissue]
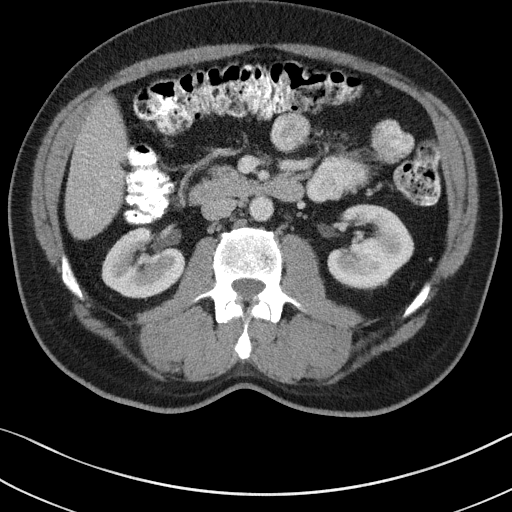
[im 63/97  bone]
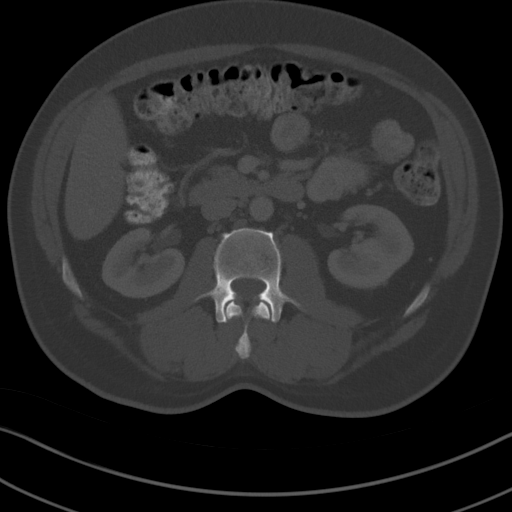
[im 68/97  soft-tissue]
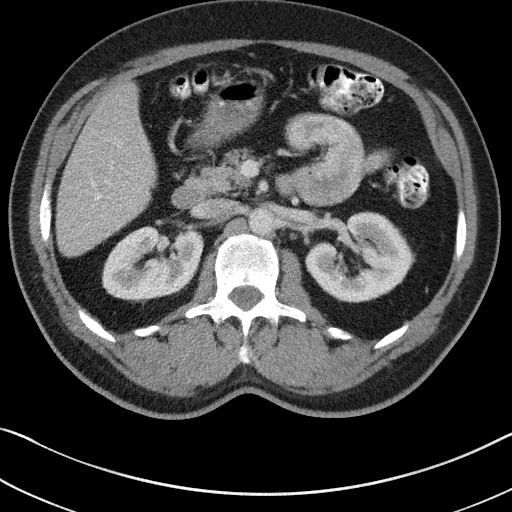
[im 74/97  soft-tissue]
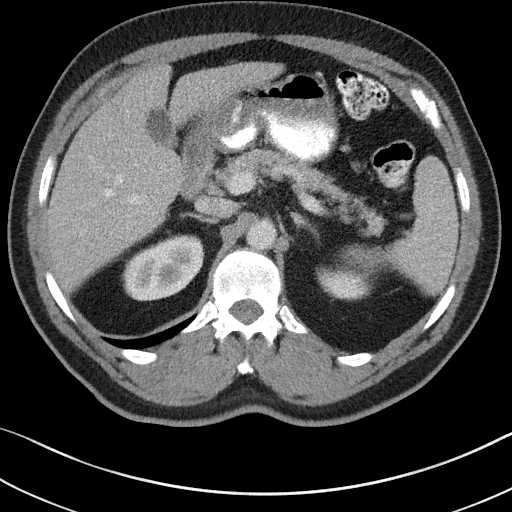
[im 85/97  soft-tissue]
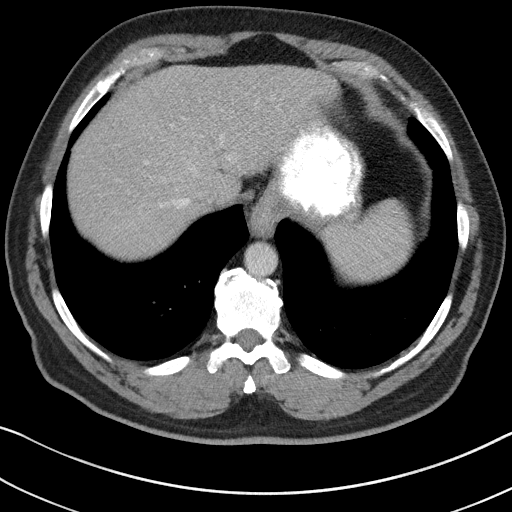
[im 91/97  soft-tissue]
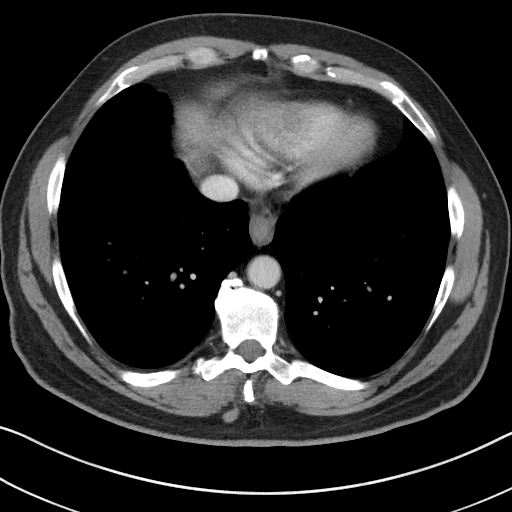

[Series 5: coronal st · coronal · 0.70mm/px · 3 of 105 slices shown]
[im 35/105  soft-tissue]
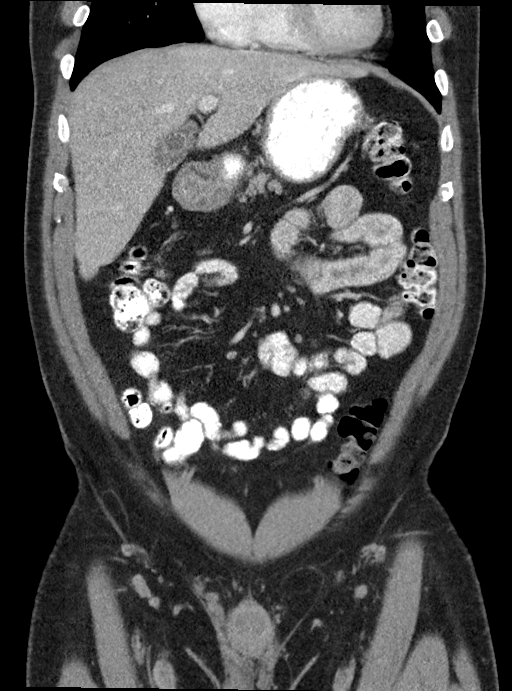
[im 47/105  soft-tissue]
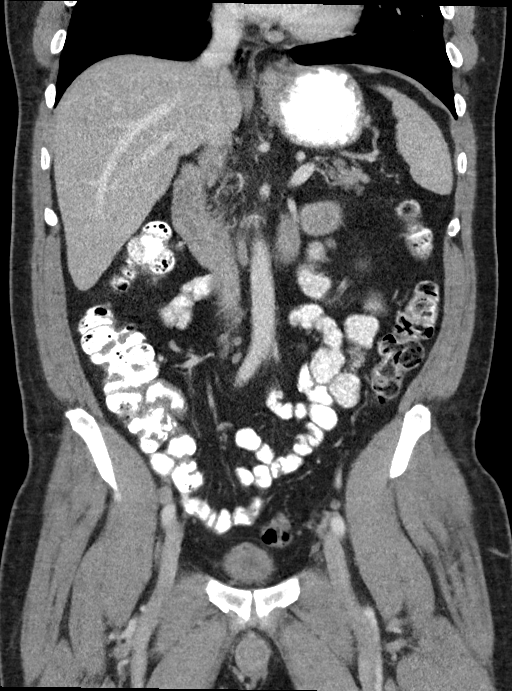
[im 58/105  soft-tissue]
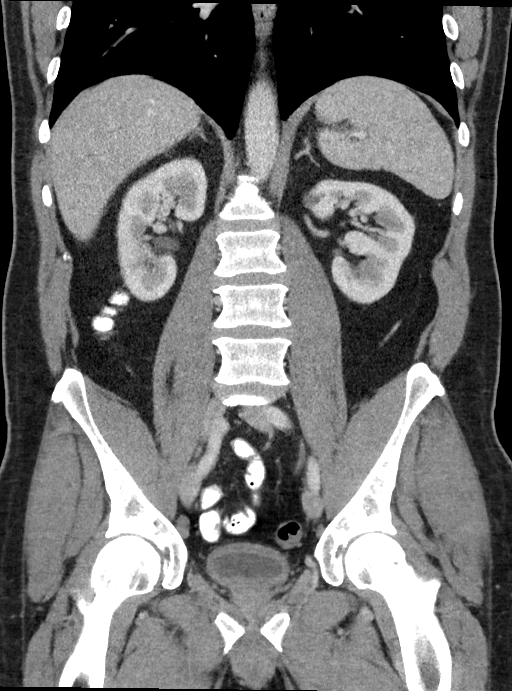

[16 of 46 positions shown; findings below may reference images not displayed]

FINDINGS: Lower chest: Lung bases are clear. Heart size normal. No pericardial
or pleural effusion. Distal esophagus is unremarkable.

Hepatobiliary: Liver is unremarkable. A noncalcified stone is seen
in the gallbladder. No biliary ductal dilatation.

Pancreas: Negative.

Spleen: Negative.

Adrenals/Urinary Tract: Adrenal glands and right kidney are
unremarkable. 13 mm fluid density cyst the left kidney. Ureters are
decompressed. Bladder is thick-walled and low in volume.

Stomach/Bowel: Tiny hiatal hernia. Stomach, small bowel, appendix
and colon are unremarkable.

Vascular/Lymphatic: Atherosclerotic calcification of the aorta
without aneurysm. Distal right common iliac lymph node measures 9 mm
(2/61). Multiple additional abdominal and pelvic retroperitoneal
lymph nodes are not enlarged by CT size criteria. Inguinal lymph
nodes measure up to 1.7 cm on the right and 1.3 cm on the left.

Reproductive: Prostate is minimally enlarged.

Other: Left inguinal hernia contains fat. No free fluid. Mesenteries
and peritoneum are otherwise unremarkable.

Musculoskeletal: Degenerative changes in the spine. No worrisome
lytic or sclerotic lesions. Probable tiny bone island in the left
femoral neck. Minimal grade 1 anterolisthesis of L4 on L5.
IMPRESSION: 1. Borderline enlarged right common iliac lymph node and enlarged
inguinal lymph nodes. Metastatic disease cannot be excluded.
2. Mild prostate enlargement. Associated bladder wall thickening is
indicative of an element of outlet obstruction.
3. Cholelithiasis.
4.  Aortic atherosclerosis (D2MM1-170.0).

## 2020-07-10 IMAGING — US US BIOPSY LYMPH NODE
1 series · 13 of 19 positions shown · non-contrast
Comparison: none

INDICATION: 58 year-old with new diagnosis of prostate cancer. CT imaging
demonstrated a prominent right inguinal lymph node. Request for
inguinal lymph node biopsy.

[Series 1: us biopsy lymph node · 13 of 19 slices shown]
[im 1/19]
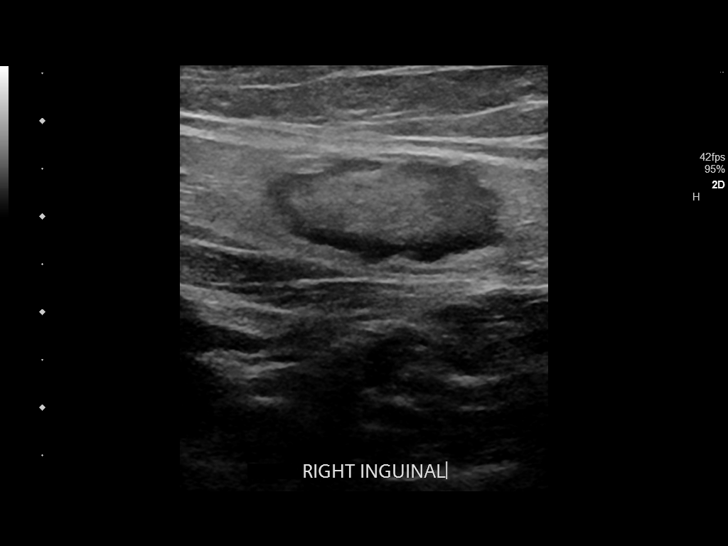
[im 3/19]
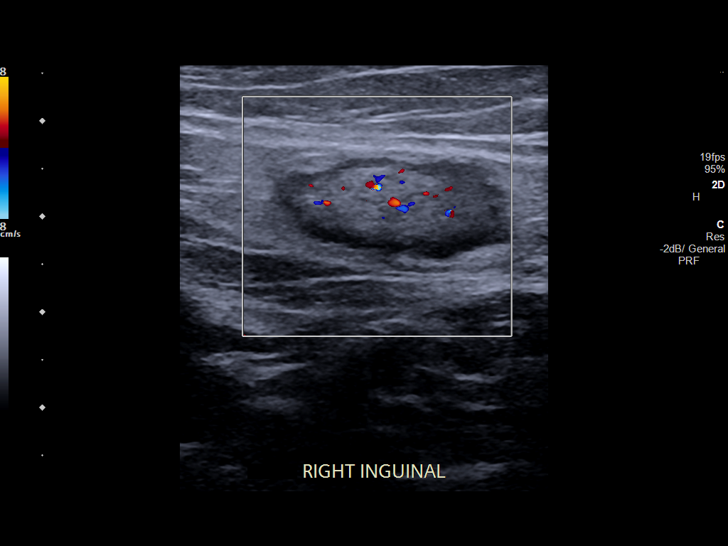
[im 4/19]
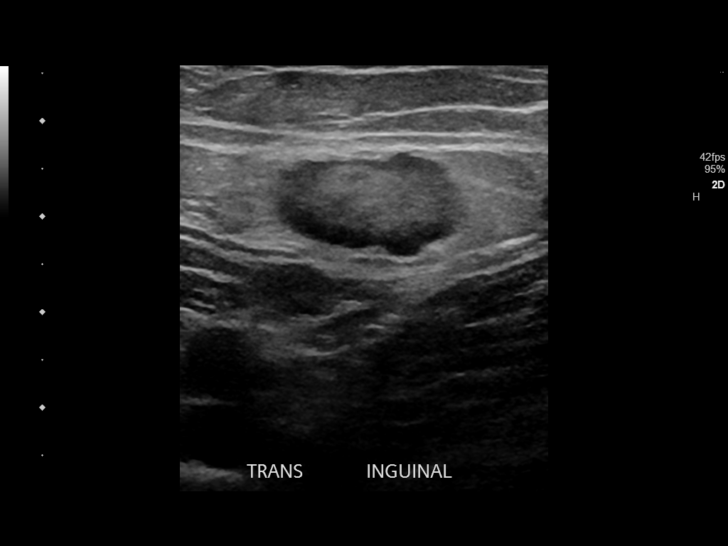
[im 6/19]
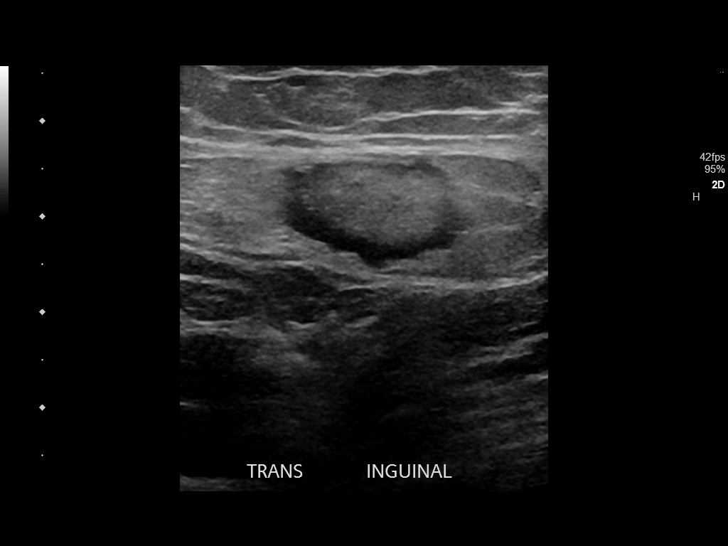
[im 7/19]
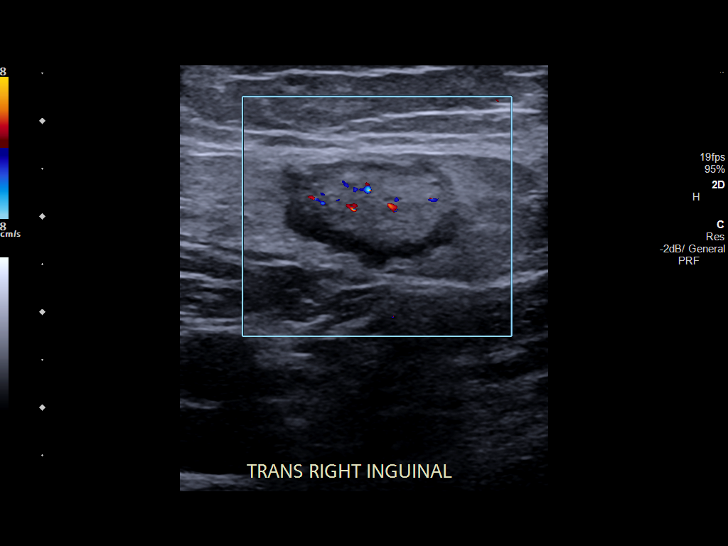
[im 9/19]
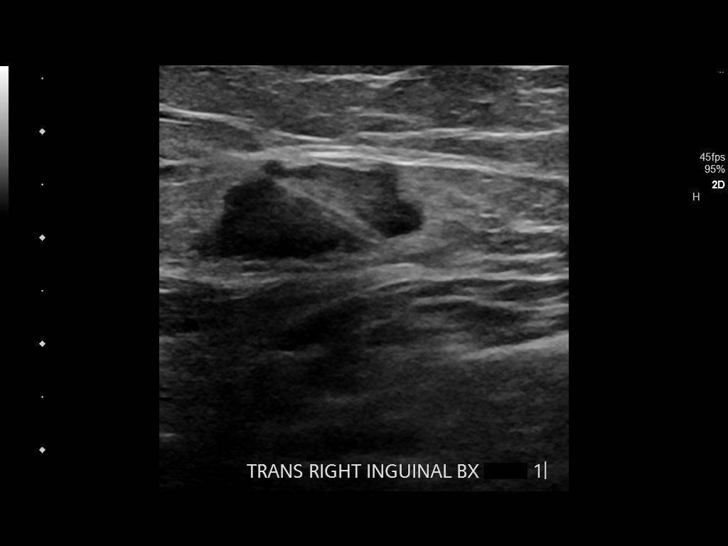
[im 10/19]
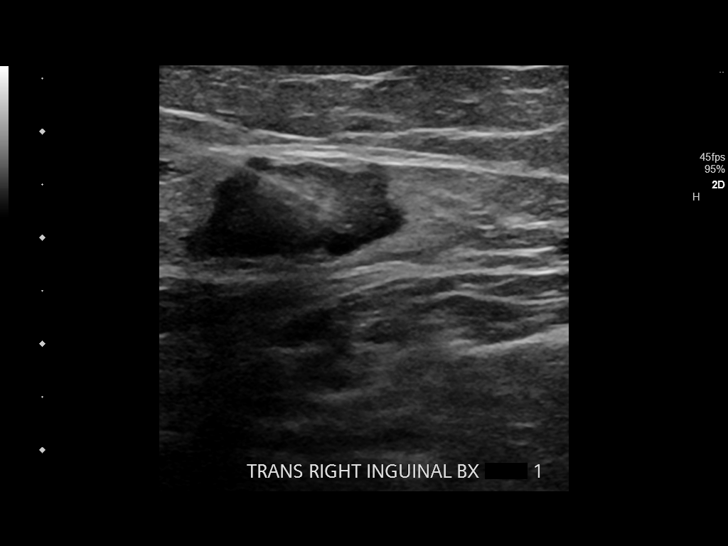
[im 11/19]
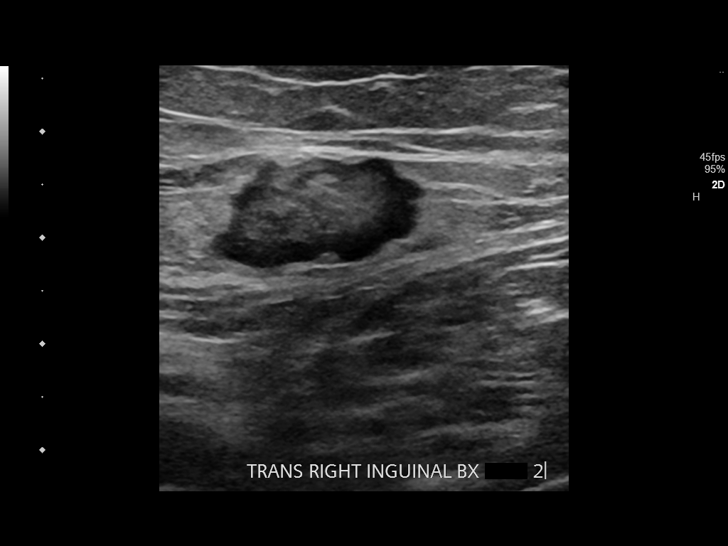
[im 13/19]
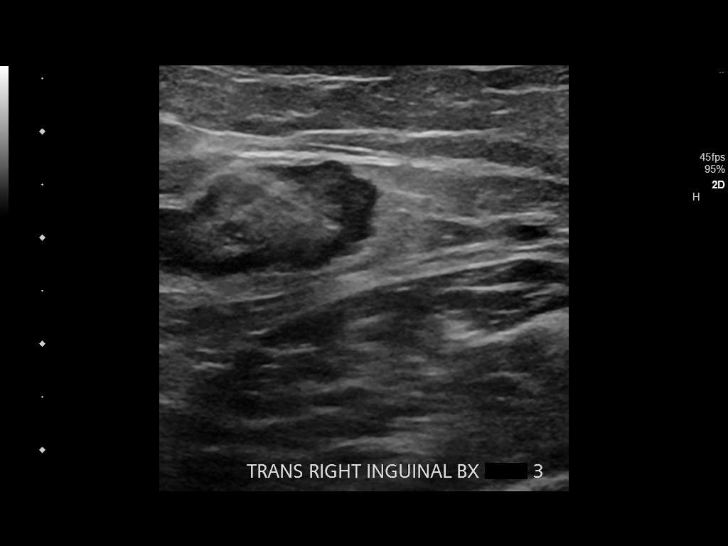
[im 14/19]
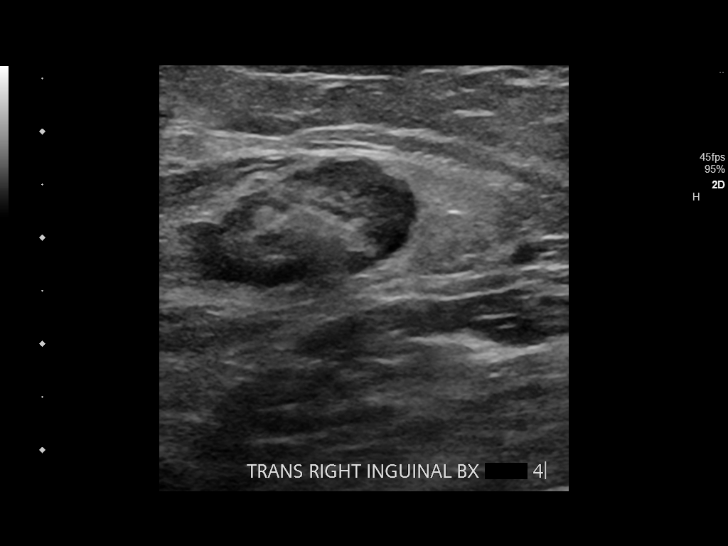
[im 16/19]
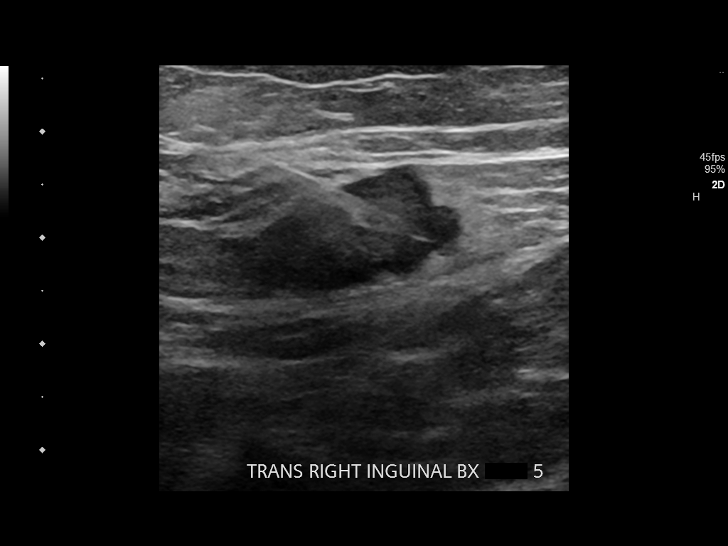
[im 17/19]
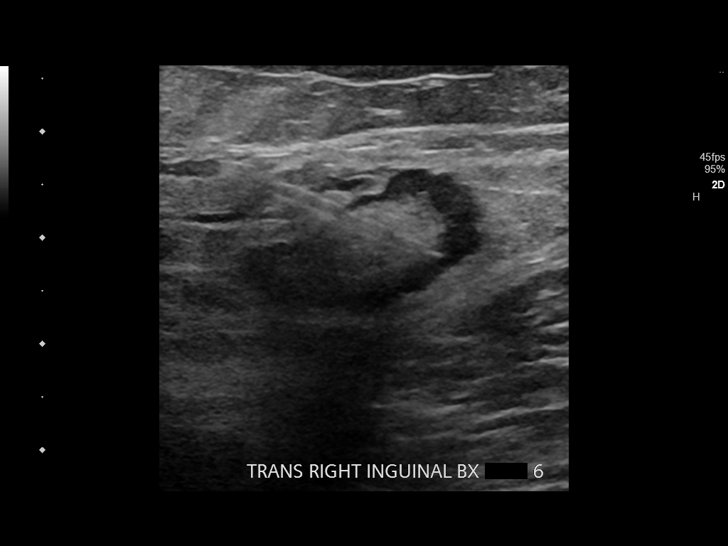
[im 19/19]
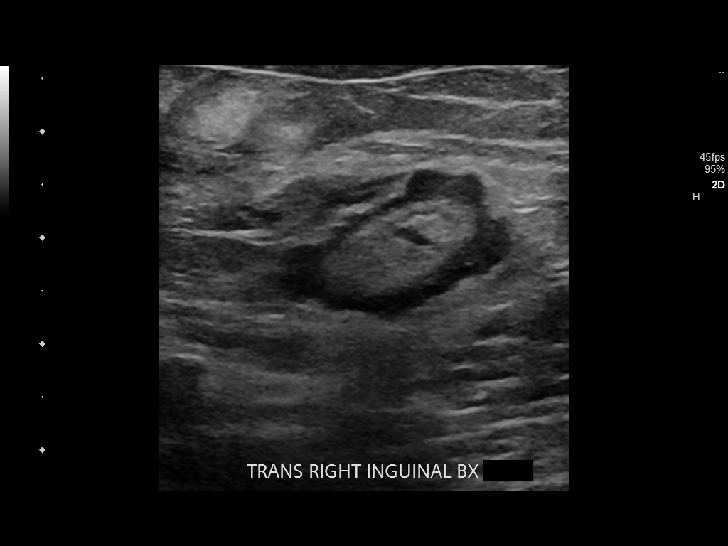

[13 of 19 positions shown; findings below may reference images not displayed]

EXAM:
ULTRASOUND-GUIDED RIGHT INGUINAL LYMPH NODE BIOPSY

MEDICATIONS:
None.

ANESTHESIA/SEDATION:
None

FLUOROSCOPY TIME:  None

COMPLICATIONS:
None immediate.

PROCEDURE:
Informed written consent was obtained from the patient after a
thorough discussion of the procedural risks, benefits and
alternatives. All questions were addressed. A timeout was performed
prior to the initiation of the procedure.

Ultrasound was used to identify the prominent right inguinal lymph
node. This skin was shaved. The skin was prepped with chlorhexidine
and sterile field was created. Skin and soft tissues were
anesthetized with 1% lidocaine. Using ultrasound guidance, 18 gauge
core needle was directed into the prominent right inguinal lymph
node. Total of 6 core biopsies were obtained. Specimens were
collected on a Telfa pad with saline. Bandage placed over the
puncture site.
FINDINGS: Prominent right inguinal lymph node with a large fatty hilum. The
lymph node measures 2.6 x 1.1 x 2.0 cm. This lymph node corresponds
with the prominent lymph node on recent CT.
IMPRESSION: Ultrasound-guided core biopsies of the prominent right inguinal
lymph node.

## 2020-09-22 ENCOUNTER — Encounter: Payer: Self-pay | Admitting: Urology

## 2020-09-22 ENCOUNTER — Encounter: Payer: Self-pay | Admitting: Radiation Oncology

## 2020-09-24 ENCOUNTER — Inpatient Hospital Stay: Payer: Self-pay | Attending: Radiation Oncology

## 2020-09-26 ENCOUNTER — Encounter: Payer: Self-pay | Admitting: *Deleted

## 2020-10-01 ENCOUNTER — Encounter: Payer: Self-pay | Admitting: Urology

## 2020-10-01 ENCOUNTER — Encounter: Payer: Self-pay | Admitting: Radiation Oncology

## 2020-10-01 ENCOUNTER — Ambulatory Visit: Payer: Self-pay | Admitting: Radiation Oncology

## 2020-10-03 ENCOUNTER — Other Ambulatory Visit: Payer: 59

## 2020-10-07 ENCOUNTER — Ambulatory Visit: Payer: 59 | Admitting: Urology

## 2020-10-08 NOTE — Telephone Encounter (Signed)
Opened in error

## 2020-11-03 ENCOUNTER — Inpatient Hospital Stay: Payer: Self-pay | Attending: Radiation Oncology

## 2020-11-03 ENCOUNTER — Other Ambulatory Visit: Payer: Self-pay

## 2020-11-03 DIAGNOSIS — C61 Malignant neoplasm of prostate: Secondary | ICD-10-CM | POA: Insufficient documentation

## 2020-11-03 LAB — PSA: Prostatic Specific Antigen: 0.1 ng/mL (ref 0.00–4.00)

## 2020-11-10 ENCOUNTER — Encounter: Payer: Self-pay | Admitting: Radiation Oncology

## 2020-11-10 ENCOUNTER — Ambulatory Visit
Admission: RE | Admit: 2020-11-10 | Discharge: 2020-11-10 | Disposition: A | Discharge status: Home / Self Care | Payer: Self-pay | Source: Ambulatory Visit | Attending: Radiation Oncology | Admitting: Radiation Oncology

## 2020-11-10 VITALS — Wt 216.0 lb

## 2020-11-10 DIAGNOSIS — C61 Malignant neoplasm of prostate: Secondary | ICD-10-CM

## 2020-11-10 NOTE — Progress Notes (Signed)
Radiation Oncology Follow up Note  Name: Robert Wells   Date:   11/10/2020 MRN:  [id number] DOB: February 09, 1961    This 60 y.o. male presents to the clinic today for 58-month follow-up status post both I-125 interstitial implant for boost as well as external beam radiation to his prostate and pelvic nodes for Gleason 8 (4+4) presenting with a PSA of 100.  REFERRING PROVIDER: Baxter Hire, MD  HPI: Patient is a 60 year old male now out 10 months having completed both external beam radiation therapy to his prostate and pelvic nodes as well as I-125 interstitial implant for boost for a Gleason 8 adenocarcinoma presenting with a PSA of 100 he is currently on ADT therapy.  He still has some frequent nocturia x4.  Specifically denies any significant diarrhea or fatigue.  His most recent PSA is 2.50  COMPLICATIONS OF TREATMENT: none  FOLLOW UP COMPLIANCE: keeps appointments   PHYSICAL EXAM:  Wt 216 lb (98 kg)   BMI 29.29 kg/m  Well-developed well-nourished patient in NAD. HEENT reveals PERLA, EOMI, discs not visualized.  Oral cavity is clear. No oral mucosal lesions are identified. Neck is clear without evidence of cervical or supraclavicular adenopathy. Lungs are clear to A&P. Cardiac examination is essentially unremarkable with regular rate and rhythm without murmur rub or thrill. Abdomen is benign with no organomegaly or masses noted. Motor sensory and DTR levels are equal and symmetric in the upper and lower extremities. Cranial nerves II through XII are grossly intact. Proprioception is intact. No peripheral adenopathy or edema is identified. No motor or sensory levels are noted. Crude visual fields are within normal range.  RADIOLOGY RESULTS: No current films for review  PLAN: Present time patient is doing well under excellent biochemical control of his prostate cancer he continues on ADT therapy under urology's direction.  I have asked to see him back in 6 months for follow-up.  Patient  knows to call with any concerns.  I would like to take this opportunity to thank you for allowing me to participate in the care of your patient.Noreene Filbert, MD

## 2020-11-24 ENCOUNTER — Encounter: Payer: Self-pay | Admitting: Urology

## 2020-11-24 ENCOUNTER — Ambulatory Visit (INDEPENDENT_AMBULATORY_CARE_PROVIDER_SITE_OTHER): Payer: BLUE CROSS/BLUE SHIELD | Admitting: Urology

## 2020-11-24 ENCOUNTER — Other Ambulatory Visit: Payer: Self-pay

## 2020-11-24 VITALS — BP 142/94 | HR 75 | Ht 72.0 in | Wt 223.0 lb

## 2020-11-24 DIAGNOSIS — R350 Frequency of micturition: Secondary | ICD-10-CM

## 2020-11-24 DIAGNOSIS — C61 Malignant neoplasm of prostate: Secondary | ICD-10-CM

## 2020-11-24 LAB — BLADDER SCAN AMB NON-IMAGING: Scan Result: 5

## 2020-11-24 MED ORDER — LEUPROLIDE ACETATE (6 MONTH) 45 MG ~~LOC~~ KIT
45.0000 mg | PACK | Freq: Once | SUBCUTANEOUS | Status: AC
  Administered 2020-11-24: 45 mg via SUBCUTANEOUS

## 2020-11-24 MED ORDER — OXYBUTYNIN CHLORIDE ER 15 MG PO TB24: 15.0000 mg | ORAL_TABLET | Freq: Every day | ORAL | 10 refills | Status: DC

## 2020-11-24 NOTE — Patient Instructions (Signed)
Take Vitamin D and Calcium daily

## 2020-11-24 NOTE — Addendum Note (Signed)
Addended by: Evelina Bucy on: 11/24/2020 10:38 AM   Modules accepted: Orders

## 2020-11-24 NOTE — Progress Notes (Signed)
11/24/2020 9:57 AM   Robert Wells 01-10-1961 [id number]  Referring provider: Baxter Hire, MD [place],  [place] [address]  Chief Complaint  Patient presents with  . Prostate Cancer    HPI: 60 year old male with high risk prostate cancer status post IMRT with brachytherapy boost on ADT he returns today for routine follow-up.  He initially presented with a PSA of around 100. Prostate biopsy revealed high risk Gleason 4+4 disease. Staging indicated borderline distal right common iliac lymph nodes measuring up to 9 mm which did not reach true pathologic criteria. He also had bilateral inguinal lymphadenopathy status post recent biopsy not consistent with prostate cancer  He received his first [place] injection on 08/2019 and ADT on 10/02/19 He has been tolerating this really well.  Underwent IMRT w/ brachytherapy boost on 12/03/19.   His PSA is currently 0.1 as of 11/03/2020.  He reports fatigue which he attributes to nighttime urinary frequency.  He is getting up 4-5 times at night to urinate with some urgency in the daytime as well.  This is bothersome to him.  He ran out of Flomax 2 weeks ago and has not really seen any difference in his urinary symptoms.  He occasionally has dysuria which comes and goes but this is not severe.  He denies any dysuria today.  No gross hematuria.  Hot flashes are waning.    He is not sexually active without sex drive.  He is not having any spontaneous erections.  This does not bother him.  PMH: Past Medical History:  Diagnosis Date  . Arthritis   . Cancer ([place])   . Hypertension   . Prostate cancer ([place]) 08/21/2019    Surgical History: Past Surgical History:  Procedure Laterality Date  . CYSTOSCOPY N/A 12/03/2019   Procedure: CYSTOSCOPY;  Surgeon: Hollice Espy, MD;  Location: [facility] ORS;  Service: Urology;  Laterality: N/A;  . NO PAST SURGERIES    . RADIOACTIVE SEED IMPLANT N/A 12/03/2019   Procedure:  RADIOACTIVE SEED IMPLANT/BRACHYTHERAPY IMPLANT;  Surgeon: Hollice Espy, MD;  Location: [facility] ORS;  Service: Urology;  Laterality: N/A;    Home Medications:  Allergies as of 11/24/2020      Reactions   Shellfish Allergy Itching, Hives, Nausea Only      Medication List       Accurate as of November 24, 2020  9:57 AM. If you have any questions, ask your nurse or doctor.        STOP taking these medications   tamsulosin 0.4 MG Caps capsule Commonly known as: Flomax Stopped by: Hollice Espy, MD     TAKE these medications   CALCIUM 1200+D3 PO Take 1 tablet by mouth daily.   ibuprofen 200 MG tablet Commonly known as: ADVIL Take 600-800 mg by mouth 2 (two) times daily as needed for headache or moderate pain.   lisinopril 20 MG tablet Commonly known as: ZESTRIL Take 20 mg by mouth daily.   oxybutynin 15 MG 24 hr tablet Commonly known as: DITROPAN XL Take 1 tablet (15 mg total) by mouth at bedtime. Started by: Hollice Espy, MD   Taltz 80 MG/ML Soaj Generic drug: Ixekizumab Inject 80 mg into the skin every 30 (thirty) days.       Allergies:  Allergies  Allergen Reactions  . Shellfish Allergy Itching, Hives and Nausea Only    Family History: No family history on file.  Social History:  reports that he has never smoked. He has never used smokeless  tobacco. He reports previous alcohol use. He reports that he does not use drugs.   Physical Exam: BP (!) 142/94   Pulse 75   Ht 6' (1.829 m)   Wt 223 lb (101.2 kg)   BMI 30.24 kg/m   Constitutional:  Alert and oriented, No acute distress. HEENT: [place] AT, moist mucus membranes.  Trachea midline, no masses. Cardiovascular: No clubbing, cyanosis, or edema. Respiratory: Normal respiratory effort, no increased work of breathing. Skin: No rashes, bruises or suspicious lesions. Neurologic: Grossly intact, no focal deficits, moving all 4 extremities. Psychiatric: Normal mood and affect.   Pertinent Imaging: Results for  orders placed or performed in visit on 11/24/20  Bladder Scan (Post Void Residual) in office  Result Value Ref Range   Scan Result 5    UA ordered, pending   Assessment & Plan:    1. Prostate cancer ([place]) Currently on ADT for at least 2 to 3 years  PSA has trended down quite nicely now to 0.1 but has never become undetectable, will continue to follow him closely.  PSA with Eligard again in 6 months.  We discussed and reviewed bone health recommendations. - leuprolide (6 Month) (ELIGARD) injection 45 mg  2. Urinary frequency Increasing bothered by urinary frequency, urgency and nocturia  No change in symptoms since being off Flomax, will discontinue his medication  We discussed trial of OAB medication,.  He will try oxybutynin 15 mg XL.  We discussed possible side effects of dry eyes, dry mouth and constipation amongst others.  He will let us know if this is effective and if not, we can tweak this medication via discussion through [place] as needed now that he lives [place] away.  He is agreeable this plan. - Bladder Scan (Post Void Residual) in office   Follow-up in 6 months with PSA prior for Eligard  Hollice Espy, MD  [place] [facility] [address] [place], [place] [address] [phone]

## 2020-11-24 NOTE — Progress Notes (Signed)
Eligard SubQ Injection   Due to Prostate Cancer patient is present today for a Eligard Injection.  Medication: Eligard 6 month Dose: 45 mg  Location: right lower abdomen Lot: [id number] Exp: 06/23  Patient tolerated well, no complications were noted  Performed by: Verlene Mayer, [place]  Per Dr. Erlene Quan patient is to continue therapy for 6 months. This appointment was scheduled using wheel and given to patient today along with reminder continue on Vitamin D 800-1000iu and Calium 1000-1200mg  daily while on Androgen Deprivation Therapy.  PA approval dates: 04/08/21

## 2020-11-25 LAB — URINALYSIS, COMPLETE
Bilirubin, UA: NEGATIVE
Glucose, UA: NEGATIVE
Ketones, UA: NEGATIVE
Leukocytes,UA: NEGATIVE
Nitrite, UA: NEGATIVE
Protein,UA: NEGATIVE
RBC, UA: NEGATIVE
Specific Gravity, UA: <1.005 — ABNORMAL LOW (ref 1.005–1.030)
Urobilinogen, Ur: 0.2 mg/dL (ref 0.2–1.0)
pH, UA: 5 (ref 5.0–7.5)

## 2020-11-25 LAB — MICROSCOPIC EXAMINATION
Bacteria, UA: NONE SEEN
Epithelial Cells (non renal): NONE SEEN /hpf (ref 0–10)

## 2020-12-22 ENCOUNTER — Encounter: Payer: Self-pay | Admitting: Urology

## 2020-12-22 ENCOUNTER — Telehealth: Payer: Self-pay | Admitting: *Deleted

## 2020-12-22 NOTE — Telephone Encounter (Signed)
Patient called in today and states he will come by on Thursday and pick up samples of gemesta

## 2020-12-22 NOTE — Telephone Encounter (Signed)
Called patient left VM to return call.

## 2021-01-30 ENCOUNTER — Encounter: Payer: Self-pay | Admitting: Urology

## 2021-05-13 ENCOUNTER — Ambulatory Visit: Payer: Self-pay | Admitting: Radiation Oncology

## 2021-05-22 ENCOUNTER — Other Ambulatory Visit: Payer: Self-pay

## 2021-05-22 DIAGNOSIS — C61 Malignant neoplasm of prostate: Secondary | ICD-10-CM

## 2021-05-25 ENCOUNTER — Other Ambulatory Visit: Payer: Self-pay

## 2021-05-25 ENCOUNTER — Other Ambulatory Visit: Payer: BLUE CROSS/BLUE SHIELD

## 2021-05-25 DIAGNOSIS — C61 Malignant neoplasm of prostate: Secondary | ICD-10-CM

## 2021-05-26 ENCOUNTER — Other Ambulatory Visit: Payer: Self-pay

## 2021-05-26 LAB — PSA: Prostate Specific Ag, Serum: <0.1 ng/mL (ref 0.0–4.0)
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
## 2021-06-02 ENCOUNTER — Encounter: Payer: Self-pay | Admitting: Urology

## 2021-06-02 ENCOUNTER — Ambulatory Visit
Admission: RE | Admit: 2021-06-02 | Discharge: 2021-06-02 | Disposition: A | Discharge status: Home / Self Care | Payer: BLUE CROSS/BLUE SHIELD | Source: Ambulatory Visit | Attending: Radiation Oncology | Admitting: Radiation Oncology

## 2021-06-02 ENCOUNTER — Other Ambulatory Visit: Payer: Self-pay

## 2021-06-02 ENCOUNTER — Other Ambulatory Visit: Payer: Self-pay | Admitting: *Deleted

## 2021-06-02 ENCOUNTER — Ambulatory Visit (INDEPENDENT_AMBULATORY_CARE_PROVIDER_SITE_OTHER): Payer: BLUE CROSS/BLUE SHIELD | Admitting: Urology

## 2021-06-02 VITALS — BP 151/90 | HR 80 | Resp 16 | Wt 223.0 lb

## 2021-06-02 VITALS — BP 151/90 | HR 80 | Ht 72.0 in | Wt 223.0 lb

## 2021-06-02 DIAGNOSIS — R350 Frequency of micturition: Secondary | ICD-10-CM | POA: Diagnosis not present

## 2021-06-02 DIAGNOSIS — R300 Dysuria: Secondary | ICD-10-CM | POA: Diagnosis not present

## 2021-06-02 DIAGNOSIS — C61 Malignant neoplasm of prostate: Secondary | ICD-10-CM

## 2021-06-02 DIAGNOSIS — Z923 Personal history of irradiation: Secondary | ICD-10-CM | POA: Diagnosis not present

## 2021-06-02 DIAGNOSIS — R3915 Urgency of urination: Secondary | ICD-10-CM | POA: Diagnosis not present

## 2021-06-02 LAB — URINALYSIS, COMPLETE
Bilirubin, UA: NEGATIVE
Glucose, UA: NEGATIVE
Ketones, UA: NEGATIVE
Leukocytes,UA: NEGATIVE
Nitrite, UA: NEGATIVE
Protein,UA: NEGATIVE
RBC, UA: NEGATIVE
Specific Gravity, UA: 1.02 (ref 1.005–1.030)
Urobilinogen, Ur: 0.2 mg/dL (ref 0.2–1.0)
pH, UA: 5.5 (ref 5.0–7.5)

## 2021-06-02 LAB — BLADDER SCAN AMB NON-IMAGING: Scan Result: 14

## 2021-06-02 MED ORDER — LEUPROLIDE ACETATE (6 MONTH) 45 MG ~~LOC~~ KIT
45.0000 mg | PACK | Freq: Once | SUBCUTANEOUS | Status: AC
  Administered 2021-06-02: 45 mg via SUBCUTANEOUS

## 2021-06-02 MED ORDER — MIRABEGRON ER 50 MG PO TB24: 50.0000 mg | ORAL_TABLET | Freq: Every day | ORAL | 11 refills | Status: DC

## 2021-06-02 NOTE — Progress Notes (Signed)
Eligard SubQ Injection   Due to Prostate Cancer patient is present today for a Eligard Injection.  Medication: Eligard 6 month Dose: 45 mg  Location: right lower abdomen Lot: [id number] Exp: 10/2022  Patient tolerated well, no complications were noted  Performed by: Verlene Mayer, [place]  Per Dr. Erlene Quan patient is to continue therapy for 6 months . Patient's next follow up was scheduled for April. This appointment was scheduled using wheel and given to patient today along with reminder continue on Vitamin D 800-1000iu and Calium 1000-1200mg  daily while on Androgen Deprivation Therapy.

## 2021-06-02 NOTE — Patient Instructions (Signed)
Continue on Vitamin D 800-1000iu and Calium 1000-1200mg  daily while on Androgen Deprivation Therapy

## 2021-06-02 NOTE — Progress Notes (Signed)
Radiation Oncology Follow up Note  Name: Robert Wells   Date:   06/02/2021 MRN:  [id number] DOB: April 25, 1961    This 60 y.o. male presents to the clinic today for 36-month follow-up status post both external beam radiation therapy to his prostate and pelvic nodes as well as I-125 interstitial implant for boost for Gleason 8 (4+4) present with a PSA of 100.  REFERRING PROVIDER: Baxter Hire, MD  HPI: Patient is a 60 year old male now out 16 months having completed both external beam as well as I-125 interstitial implant for locally advanced Gleason 8 adenocarcinoma the prostate presenting with a PSA of 100 he is currently on ADT therapy he has been having some.  Increased lower urinary tract symptoms such as frequency urgency.  Currently is on Myrbetriq.  His most recent PSA is less than 0.1  COMPLICATIONS OF TREATMENT: none  FOLLOW UP COMPLIANCE: keeps appointments   PHYSICAL EXAM:  BP (!) 151/90 (BP Location: Left Arm, Patient Position: Sitting)   Pulse 80   Resp 16   Wt 223 lb (101.2 kg)   BMI 30.24 kg/m  Well-developed well-nourished patient in NAD. HEENT reveals PERLA, EOMI, discs not visualized.  Oral cavity is clear. No oral mucosal lesions are identified. Neck is clear without evidence of cervical or supraclavicular adenopathy. Lungs are clear to A&P. Cardiac examination is essentially unremarkable with regular rate and rhythm without murmur rub or thrill. Abdomen is benign with no organomegaly or masses noted. Motor sensory and DTR levels are equal and symmetric in the upper and lower extremities. Cranial nerves II through XII are grossly intact. Proprioception is intact. No peripheral adenopathy or edema is identified. No motor or sensory levels are noted. Crude visual fields are within normal range.  RADIOLOGY RESULTS: No current films to review  PLAN: Present time patient continues to have some increased lower urinary tract symptoms as outlined above.  Currently on  Myrbetriq.  He continues follow-up care with urology.  He continues on ADT therapy.  I have asked to see him back in 6 months for follow-up.  Patient knows to call with any concerns.  I would like to take this opportunity to thank you for allowing me to participate in the care of your patient.Noreene Filbert, MD

## 2021-06-10 NOTE — Progress Notes (Signed)
   06/11/2021  CC:  Chief Complaint  Patient presents with   Cysto    HPI: Robert Wells is a 60 y.o. male with a personal history of high risk prostate cancer and urinary frequency, who presents today for cystoscopy.    He is s/p IMRT with brachytherapy boost on 12/03/2019 and on ADT since 10/02/2019. On 08/2019 he received his first [place] injection.   Prostate biopsy revealed high risk Gleason 4+4 disease.  Staging indicated borderline distal right common iliac lymph nodes measuring up to 9 mm which did not reach true pathologic criteria.  He also had bilateral inguinal lymphadenopathy status post recent biopsy not consistent with prostate cancer.    His most recent PSA on 05/25/2021 was <0.1.   He reports today he has some improvement in urgency and urge incontinence on myrbetriq.    Vitals:   06/11/21 0919  BP: (!) 159/79  Pulse: 73  NED. A&Ox3.   No respiratory distress   Abd soft, NT, ND Normal phallus with bilateral descended testicles  Cystoscopy Procedure Note  Patient identification was confirmed, informed consent was obtained, and patient was prepped using Betadine solution.  Lidocaine jelly was administered per urethral meatus.     Pre-Procedure: - Inspection reveals a normal caliber ureteral meatus.  Procedure: The flexible cystoscope was introduced without difficulty - No urethral strictures/lesions are present. - Normal prostate bilobar coaptation which is fairly tight through the fossa  - Normal bladder neck - Bilateral ureteral orifices identified - Bladder mucosa  reveals no ulcers, tumors, or lesions - No bladder stones - mild trabeculation  Retroflexion shows unremarkable    Post-Procedure: - Patient tolerated the procedure well  Assessment/ Plan:  Urinary frequency  - Has seen improvement on Mybetriq 50 mg.   - Cystoscopy today revealed bilobar coaptation of the prostate with a fairly tight fossa, but was otherwise  retroflexion  shows unremarkable.   -Discussed Urodynamics versus Botox versus optimizing medicine versus Urolift versus TURP. All options were explained in detail today as well as risk of Urolift. He would like to purse optimization of medication   - Continue Myrbetriq 50 mg, prescribed Flomax today.   Follow-up in 1 month for recheck of symptoms    I,Kailey Littlejohn,acting as a scribe for Hollice Espy, MD.,have documented all relevant documentation on the behalf of Hollice Espy, MD,as directed by  Hollice Espy, MD while in the presence of Hollice Espy, MD.  I have reviewed the above documentation for accuracy and completeness, and I agree with the above.   Hollice Espy, MD

## 2021-06-11 ENCOUNTER — Ambulatory Visit (INDEPENDENT_AMBULATORY_CARE_PROVIDER_SITE_OTHER): Payer: BLUE CROSS/BLUE SHIELD | Admitting: Urology

## 2021-06-11 ENCOUNTER — Other Ambulatory Visit: Payer: Self-pay

## 2021-06-11 VITALS — BP 159/79 | HR 73 | Ht 72.0 in | Wt 217.0 lb

## 2021-06-11 DIAGNOSIS — R300 Dysuria: Secondary | ICD-10-CM | POA: Diagnosis not present

## 2021-06-11 MED ORDER — TAMSULOSIN HCL 0.4 MG PO CAPS: 0.4000 mg | ORAL_CAPSULE | Freq: Every day | ORAL | 0 refills | Status: DC

## 2021-06-11 NOTE — Patient Instructions (Signed)
Transurethral Resection of the Prostate Transurethral resection of the prostate (TURP) is the removal (resection) of part of the gland that produces semen (prostate gland). This procedure is done to treat benign prostatic hyperplasia (BPH). BPH is an abnormal, noncancerous (benign) increase in the number of cells that make up the prostate tissue. BPH causes the prostate to get bigger. The enlarged prostate can push against or block the tube that drains urine from the bladder out of the body (urethra). BPH can affect normal urine flow by causing bladder infections, difficulty controlling bladder function, and difficulty emptying the bladder.  The goal of TURP is to remove enough prostate tissue to allow for a normal flow of urine. The procedure will allow you to empty your bladder more completely when you urinate so that you can urinate less often. In a transurethral resection, a thin telescope with a light, a tiny camera, and an electric cutting edge (resectoscope) is passed through the urethra and into the prostate. The opening of the urethra is at the end of the penis. Tell a health care provider about: Any allergies you have. All medicines you are taking, including vitamins, herbs, eye drops, creams, and over-the-counter medicines. Any problems you or family members have had with anesthetic medicines. Any blood disorders you have. Any surgeries you have had. Any medical conditions you have. Any prostate infections you have had. What are the risks? Generally, this is a safe procedure. However, problems may occur, including: Infection. Bleeding. Allergic reactions to medicines. Damage to other structures or organs, such as: The urethra. The bladder. Muscles that surround the prostate. Difficulty getting an erection. Inability to control when you urinate (incontinence). Scarring, which may cause problems with urine flow. What happens before the procedure? Medicines Ask your health care  provider about: Changing or stopping your regular medicines. This is especially important if you are taking diabetes medicines or blood thinners. Taking medicines such as aspirin and ibuprofen. These medicines can thin your blood. Do not take these medicines unless your health care provider tells you to take them. Taking over-the-counter medicines, vitamins, herbs, and supplements. Eating and drinking Follow instructions from your health care provider about eating and drinking, which may include: 8 hours before the procedure - stop eating heavy meals or foods, such as meat, fried foods, or fatty foods. 6 hours before the procedure - stop eating light meals or foods, such as toast or cereal. 6 hours before the procedure - stop drinking milk or drinks that contain milk. 2 hours before the procedure - stop drinking clear liquids. Staying hydrated Follow instructions from your health care provider about hydration, which may include: Up to 2 hours before the procedure - you may continue to drink clear liquids, such as water, clear fruit juice, black coffee, and plain tea. General instructions You may have a physical exam. You may have a blood or urine sample taken. Ask your health care provider what steps will be taken to help prevent infection. These may include: Washing skin with a germ-killing soap. Taking antibiotic medicine. Plan to have someone take you home from the hospital or clinic. You may not be able to drive for up to 10 days after your procedure. Plan to have a responsible adult care for you for at least 24 hours after you leave the hospital or clinic. This is important. What happens during the procedure?  An IV will be inserted into one of your veins. You will be given one or more of the following: A medicine  to help you relax (sedative). A medicine to make you fall asleep (general anesthetic). A medicine that is injected into your spine to numb the area below and slightly above  the injection site (spinal anesthetic). Your legs will be placed in foot rests (stirrups) so that your legs are apart and your knees are bent. The resectoscope will be passed through your urethra to your prostate. Parts of your prostate will be resected using the cutting edge of the resectoscope. The resectoscope will be removed. A small, thin tube (catheter) will be passed through your urethra and into your bladder. The catheter will drain urine into a bag outside of your body. Fluid may be passed through the catheter to keep the catheter open. The procedure may vary among health care providers and hospitals. What happens after the procedure? Your blood pressure, heart rate, breathing rate, and blood oxygen level will be monitored until you leave the hospital or clinic. You may continue to receive fluids and medicines through an IV. You may have some pain. Pain medicine will be available to help you. You will have a catheter draining your urine. You may have blood in your urine. Your catheter may be kept in until your urine is clear. Your urinary drainage will be monitored. If necessary, your bladder may be rinsed out (irrigated) through your catheter. You will be encouraged to walk around as soon as possible. You may have to wear compression stockings. These stockings help prevent blood clots and reduce swelling in your legs. Do not drive for 24 hours if you were given a sedative during your procedure. Summary Transurethral resection of the prostate (TURP) is the removal (resection) of part of the gland that produces semen (prostate gland). The goal of this procedure is to remove enough prostate tissue to allow for a normal flow of urine. Follow instructions from your health care provider about taking medicines and about eating and drinking before the procedure. This information is not intended to replace advice given to you by your health care provider. Make sure you discuss any questions  you have with your health care provider. Document Revised: 11/12/2020 Document Reviewed: 05/03/2018 Elsevier Patient Education  2022 [employer].

## 2021-07-14 NOTE — Progress Notes (Signed)
07/15/21 9:35 AM   Robert Wells 04/28/1961 [id number]  Referring provider:  Baxter Hire, MD [place],  [place] [phone] Chief Complaint  Patient presents with   Follow-up     HPI: Robert Wells is a 60 y.o.male with a personal history of high risk prostate cancer and urinary frequency, who returns today for 4 month follow-up.   He is s/p IMRT with brachytherapy boost on 12/03/2019 and on ADT since 10/02/2019. On 08/2019 he received his first [place] injection.    Prostate biopsy revealed high risk Gleason 4+4 disease.  Staging indicated borderline distal right common iliac lymph nodes measuring up to 9 mm which did not reach true pathologic criteria.  He also had bilateral inguinal lymphadenopathy status post recent biopsy not consistent with prostate cancer.   He underwent cystoscopy on 06/11/2021 that revealed bilobar coaptation of the prostate with a fairly tight fossa, but otherwise unremarkable.  He reports that he has seen improvement on Mybetriq 50. He still has to urinate 2-3x nightly. He denies any daytime symptoms or urinary accidents.   He does have daytime frequency and cannot wait long to get to the bathroom but has not had episodes of incontinence.  He used to get up 6 or 7 times a night to void.  He reports at least 50% improvement in his urinary symptoms.  He previously failed Gemtesa 75 mg.     PMH: Past Medical History:  Diagnosis Date   Arthritis    Cancer ([facility])    Hypertension    Prostate cancer ([facility]) 08/21/2019    Surgical History: Past Surgical History:  Procedure Laterality Date   CYSTOSCOPY N/A 12/03/2019   Procedure: CYSTOSCOPY;  Surgeon: Hollice Espy, MD;  Location: [facility] ORS;  Service: Urology;  Laterality: N/A;   NO PAST SURGERIES     RADIOACTIVE SEED IMPLANT N/A 12/03/2019   Procedure: RADIOACTIVE SEED IMPLANT/BRACHYTHERAPY IMPLANT;  Surgeon: Hollice Espy, MD;  Location: [facility] ORS;  Service: Urology;   Laterality: N/A;    Home Medications:  Allergies as of 07/15/2021       Reactions   Shellfish Allergy Itching, Hives, Nausea Only        Medication List        Accurate as of July 15, 2021  9:35 AM. If you have any questions, ask your nurse or doctor.          CALCIUM 1200+D3 PO Take 1 tablet by mouth daily.   etanercept 25 MG injection Commonly known as: ENBREL Inject 25 mg into the skin.   ibuprofen 200 MG tablet Commonly known as: ADVIL Take 600-800 mg by mouth 2 (two) times daily as needed for headache or moderate pain.   lisinopril 20 MG tablet Commonly known as: ZESTRIL Take 20 mg by mouth daily.   mirabegron ER 50 MG Tb24 tablet Commonly known as: MYRBETRIQ Take 1 tablet (50 mg total) by mouth daily.   tamsulosin 0.4 MG Caps capsule Commonly known as: FLOMAX Take 1 capsule (0.4 mg total) by mouth daily.        Allergies:  Allergies  Allergen Reactions   Shellfish Allergy Itching, Hives and Nausea Only    Family History: No family history on file.  Social History:  reports that he has never smoked. He has never used smokeless tobacco. He reports that he does not currently use alcohol. He reports that he does not use drugs.   Physical Exam: BP (!) 144/97   Pulse 74  Ht 6' (1.829 m)   Wt 217 lb (98.4 kg)   BMI 29.43 kg/m   Constitutional:  Alert and oriented, No acute distress. HEENT: [place] AT, moist mucus membranes.  Trachea midline, no masses. Cardiovascular: No clubbing, cyanosis, or edema. Respiratory: Normal respiratory effort, no increased work of breathing. Skin: No rashes, bruises or suspicious lesions. Neurologic: Grossly intact, no focal deficits, moving all 4 extremities. Psychiatric: Normal mood and affect.  Pertinent imaging:  Results for orders placed or performed in visit on 07/15/21  Bladder Scan (Post Void Residual) in office  Result Value Ref Range   Scan Result 10      Assessment & Plan:    Urinary  frequency  - Has seen improvement on Mybetriq 50 mg continues to have daytime and nighttime symptoms, 50% or so improvement  - We discussed optimization of medications versus Botox. Risks and benifts of Botox were reviewed. He is not interested in Botox at this time and would like to optimize medication.  He will let us know how dual pharmacotherapy goes and if he would like to pursue Botox in the future.  Alternatives including PTNS was also briefly mention.  - Prescribed oxybutynin 10 mg XL today be taken in addition to Myrbetriq.  - Continue Flomax.   2. Prostate cancer  - Currently on ADT for 1 year  -f/u with PSA/ eligard  Return for Eligard and PSA  I,Kailey Littlejohn,acting as a scribe for Hollice Espy, MD.,have documented all relevant documentation on the behalf of Hollice Espy, MD,as directed by  Hollice Espy, MD while in the presence of Hollice Espy, MD.  I have reviewed the above documentation for accuracy and completeness, and I agree with the above.   Hollice Espy, MD    [facility] [address] [place], [place] [address] [phone]

## 2021-07-15 ENCOUNTER — Other Ambulatory Visit: Payer: Self-pay

## 2021-07-15 ENCOUNTER — Encounter: Payer: Self-pay | Admitting: Urology

## 2021-07-15 ENCOUNTER — Ambulatory Visit (INDEPENDENT_AMBULATORY_CARE_PROVIDER_SITE_OTHER): Payer: BLUE CROSS/BLUE SHIELD | Admitting: Urology

## 2021-07-15 VITALS — BP 144/97 | HR 74 | Ht 72.0 in | Wt 217.0 lb

## 2021-07-15 DIAGNOSIS — R350 Frequency of micturition: Secondary | ICD-10-CM | POA: Diagnosis not present

## 2021-07-15 LAB — BLADDER SCAN AMB NON-IMAGING: Scan Result: 10

## 2021-07-15 MED ORDER — MIRABEGRON ER 50 MG PO TB24: 50.0000 mg | ORAL_TABLET | Freq: Every day | ORAL | 11 refills | Status: DC

## 2021-07-15 MED ORDER — OXYBUTYNIN CHLORIDE ER 10 MG PO TB24: 10.0000 mg | ORAL_TABLET | Freq: Every day | ORAL | 11 refills | Status: DC

## 2021-07-17 ENCOUNTER — Telehealth: Payer: Self-pay | Admitting: *Deleted

## 2021-07-17 NOTE — Telephone Encounter (Signed)
Robert Wells (Key: [place]) - [id number] PA initiated for Myrbetriq 50MG  er tablets

## 2021-07-20 NOTE — Telephone Encounter (Addendum)
Received Approval 12/22-12/23-left vm with details for patient

## 2021-09-07 ENCOUNTER — Other Ambulatory Visit: Payer: Self-pay | Admitting: Urology

## 2021-09-07 MED ORDER — TAMSULOSIN HCL 0.4 MG PO CAPS: 0.4000 mg | ORAL_CAPSULE | Freq: Every day | ORAL | 0 refills | Status: DC

## 2021-09-09 ENCOUNTER — Encounter: Payer: Self-pay | Admitting: Urology

## 2021-10-30 ENCOUNTER — Ambulatory Visit: Payer: BLUE CROSS/BLUE SHIELD | Admitting: Radiation Oncology

## 2021-11-26 ENCOUNTER — Other Ambulatory Visit: Payer: Self-pay

## 2021-11-26 DIAGNOSIS — C61 Malignant neoplasm of prostate: Secondary | ICD-10-CM

## 2021-12-01 ENCOUNTER — Ambulatory Visit: Payer: BLUE CROSS/BLUE SHIELD | Admitting: Urology

## 2021-12-01 ENCOUNTER — Other Ambulatory Visit: Payer: BLUE CROSS/BLUE SHIELD

## 2021-12-01 DIAGNOSIS — C61 Malignant neoplasm of prostate: Secondary | ICD-10-CM

## 2021-12-02 LAB — PSA: Prostate Specific Ag, Serum: <0.1 ng/mL (ref 0.0–4.0)

## 2021-12-06 ENCOUNTER — Other Ambulatory Visit: Payer: Self-pay | Admitting: Urology

## 2021-12-07 ENCOUNTER — Other Ambulatory Visit: Payer: BLUE CROSS/BLUE SHIELD

## 2021-12-07 MED ORDER — TAMSULOSIN HCL 0.4 MG PO CAPS: 0.4000 mg | ORAL_CAPSULE | Freq: Every day | ORAL | 3 refills | Status: DC

## 2021-12-07 NOTE — Progress Notes (Signed)
? ?12/08/2021 ?3:42 PM  ? ?Robert Wells ?Jul 21, 1961 ?[id number] ? ?Referring provider:  ?Baxter Hire, MD ?[address] ?[place],  [place] [address] ?Chief Complaint  ?Patient presents with  ? Prostate Cancer  ?  71mofollow up  ? ? ? ?HPI: ?CDevlyn Retteris a 61y.o.male  with a personal history of high risk prostate cancer and urinary frequency, who returns today for a 6 month follow-up with Eligard.  ? ?He is s/p IMRT with brachytherapy boost on 12/03/2019 and on ADT since 10/02/2019. On 08/2019 he received his first [place]eminjection.  ?  ?Prostate biopsy revealed high risk Gleason 4+4 disease.  Staging indicated borderline distal right common iliac lymph nodes measuring up to 9 mm which did not reach true pathologic criteria.  He also had bilateral inguinal lymphadenopathy status post recent biopsy not consistent with prostate cancer.  ?  ?He underwent cystoscopy on 06/11/2021 that revealed bilobar coaptation of the prostate with a fairly tight fossa, but otherwise unremarkable. ? ?His  most recent PSA on 12/01/2021 remains undetectable.   ? ?He reports that his nighttime symptoms have improved he is still taking Flomax and oxybutynin.  ? ? ?PMH: ?Past Medical History:  ?Diagnosis Date  ? Arthritis   ? Cancer [facility]   ? Hypertension   ? Prostate cancer ([place] 08/21/2019  ? ? ?Surgical History: ?Past Surgical History:  ?Procedure Laterality Date  ? CYSTOSCOPY N/A 12/03/2019  ? Procedure: CYSTOSCOPY;  Surgeon: BHollice Espy MD;  Location: [facility] ORS;  Service: Urology;  Laterality: N/A;  ? NO PAST SURGERIES    ? RADIOACTIVE SEED IMPLANT N/A 12/03/2019  ? Procedure: RADIOACTIVE SEED IMPLANT/BRACHYTHERAPY IMPLANT;  Surgeon: BHollice Espy MD;  Location: [facility] ORS;  Service: Urology;  Laterality: N/A;  ? ? ?Home Medications:  ?Allergies as of 12/08/2021   ? ?   Reactions  ? Shellfish Allergy Itching, Hives, Nausea Only  ? ?  ? ?  ?Medication List  ?  ? ?  ? Accurate as of December 08, 2021  3:42 PM. If you have  any questions, ask your nurse or doctor.  ?  ?  ? ?  ? ?STOP taking these medications   ? ?mirabegron ER 50 MG Tb24 tablet ?Commonly known as: MYRBETRIQ ?Stopped by: AHollice Espy MD ?  ? ?  ? ?TAKE these medications   ? ?CALCIUM 1200+D3 PO ?Take 1 tablet by mouth daily. ?  ?etanercept 25 MG injection ?Commonly known as: ENBREL ?Inject 25 mg into the skin. ?  ?ibuprofen 200 MG tablet ?Commonly known as: ADVIL ?Take 600-800 mg by mouth 2 (two) times daily as needed for headache or moderate pain. ?  ?lisinopril 20 MG tablet ?Commonly known as: ZESTRIL ?Take 20 mg by mouth daily. ?  ?methotrexate 2.5 MG tablet ?Commonly known as: RHEUMATREX ?SMARTSIG:5 Tablet(s) By Mouth Once a Week ?  ?oxybutynin 10 MG 24 hr tablet ?Commonly known as: DITROPAN-XL ?Take 1 tablet (10 mg total) by mouth daily. ?  ?tamsulosin 0.4 MG Caps capsule ?Commonly known as: FLOMAX ?Take 1 capsule (0.4 mg total) by mouth daily. ?  ? ?  ? ? ?Allergies:  ?Allergies  ?Allergen Reactions  ? Shellfish Allergy Itching, Hives and Nausea Only  ? ? ?Family History: ?No family history on file. ? ?Social History:  reports that he has never smoked. He has never used smokeless tobacco. He reports that he does not currently use alcohol. He reports that he does not use drugs. ? ? ?Physical  Exam: ?BP (!) 149/77   Pulse 80   Ht 6' (1.829 m)   Wt 224 lb (101.6 kg)   BMI 30.38 kg/m?   ?Constitutional:  Alert and oriented, No acute distress. ?HEENT: [place] AT, moist mucus membranes.  Trachea midline, no masses. ?Cardiovascular: No clubbing, cyanosis, or edema. ?Respiratory: Normal respiratory effort, no increased work of breathing. ?Skin: No rashes, bruises or suspicious lesions. ?Neurologic: Grossly intact, no focal deficits, moving all 4 extremities. ?Psychiatric: Normal mood and affect. ? ? ?Pertinent Imaging: ?Results for orders placed or performed in visit on 12/08/21  ?BLADDER SCAN AMB NON-IMAGING  ?Result Value Ref Range  ? Scan Result 40m   ? ?Assessment &  Plan:   ? ?Prostate cancer  ?-Status post IMRT +2 years ADT ?- PSA undetectable  ?- In light of poor tolerance on ADT, undetectable PSA, and insurance issues will have him stop ADT after the completion of 2 years. Discussed side effects after stopping ADT including heat flashes, hair thinning, etc.   ?- Discussed importance of continuing bone health , recommend 1000-1200 mg daily calcium suppliment and [phone] IU vit D daily.  Also encouraged weight being exercises and cardiovascular health. ?-Discussed nadar number for PSA after stopping ADT.  ?- Will monitor in 6 months with PSA  ? ?2. BPH with Urinary frequency  ?- Symptomatically improved  ?- Continue flomax and oxybutynin, okay for trials of cessation of this medication for symptoms begin to improve ? ?Return in about 1 year (around 12/09/2022) for 633moSA only, 1 year w/PSA , IPSS, PVR. ? ?I,Conley Rollss a scribe for AsHollice EspyMD.,have documented all relevant documentation on the behalf of AsHollice EspyMD,as directed by  AsHollice EspyMD while in the presence of AsHollice EspyMD. ? ?I have reviewed the above documentation for accuracy and completeness, and I agree with the above.  ? ?AsHollice EspyMD ? ? ?[place] ?[place] [phone]

## 2021-12-08 ENCOUNTER — Ambulatory Visit: Payer: BLUE CROSS/BLUE SHIELD | Admitting: Radiation Oncology

## 2021-12-08 ENCOUNTER — Encounter: Payer: Self-pay | Admitting: Urology

## 2021-12-08 ENCOUNTER — Ambulatory Visit (INDEPENDENT_AMBULATORY_CARE_PROVIDER_SITE_OTHER): Payer: BLUE CROSS/BLUE SHIELD | Admitting: Urology

## 2021-12-08 VITALS — BP 149/77 | HR 80 | Ht 72.0 in | Wt 224.0 lb

## 2021-12-08 DIAGNOSIS — N401 Enlarged prostate with lower urinary tract symptoms: Secondary | ICD-10-CM

## 2021-12-08 DIAGNOSIS — C61 Malignant neoplasm of prostate: Secondary | ICD-10-CM

## 2021-12-08 DIAGNOSIS — R350 Frequency of micturition: Secondary | ICD-10-CM | POA: Diagnosis not present

## 2021-12-08 DIAGNOSIS — Z8546 Personal history of malignant neoplasm of prostate: Secondary | ICD-10-CM

## 2021-12-08 LAB — BLADDER SCAN AMB NON-IMAGING

## 2022-05-19 ENCOUNTER — Encounter: Payer: Self-pay | Admitting: Urology

## 2022-05-20 LAB — PSA: Prostate Specific Ag, Serum: <0.1 ng/mL (ref 0.0–4.0)

## 2022-06-10 ENCOUNTER — Other Ambulatory Visit: Payer: BLUE CROSS/BLUE SHIELD

## 2022-12-01 LAB — PSA: Prostate Specific Ag, Serum: <0.1 ng/mL (ref 0.0–4.0)

## 2022-12-01 NOTE — Telephone Encounter (Signed)
PSA resulted in the chart

## 2022-12-07 ENCOUNTER — Ambulatory Visit: Payer: Medicare Other | Admitting: Urology

## 2022-12-07 ENCOUNTER — Encounter: Payer: Self-pay | Admitting: Urology

## 2022-12-07 VITALS — BP 165/79 | HR 65 | Ht 72.0 in | Wt 224.0 lb

## 2022-12-07 DIAGNOSIS — C61 Malignant neoplasm of prostate: Secondary | ICD-10-CM | POA: Diagnosis not present

## 2022-12-07 DIAGNOSIS — R350 Frequency of micturition: Secondary | ICD-10-CM | POA: Diagnosis not present

## 2022-12-07 DIAGNOSIS — N528 Other male erectile dysfunction: Secondary | ICD-10-CM

## 2022-12-07 LAB — BLADDER SCAN AMB NON-IMAGING: Scan Result: 56

## 2022-12-07 NOTE — Progress Notes (Signed)
I, Amy L Pierron,acting as a scribe for Vanna Scotland, MD.,have documented all relevant documentation on the behalf of Vanna Scotland, MD,as directed by  Vanna Scotland, MD while in the presence of Vanna Scotland, MD.  12/07/2022 10:51 AM   Robert Wells 29-Apr-1961 [id number]  Referring provider: Gracelyn Nurse, MD [address] [place],  [place] [address]  Chief Complaint  Patient presents with   Prostate Cancer   Urinary Frequency    HPI: 62 year-old male with personal history of prostate cancer returns today for routine annual follow-up.   He was diagnosed with high-risk prostate cancer in 2021. He is s/p IMRT with brachytherapy boost on 12/03/2019 and on ADT since 10/02/2019. On 08/2019 he received his first [place] injection.    Prostate biopsy revealed high risk Gleason 4+4 disease.  Staging indicated borderline distal right common iliac lymph nodes measuring up to 9 mm which did not reach true pathologic criteria.  He also had bilateral inguinal lymphadenopathy status post recent biopsy not consistent with prostate cancer.    He underwent cystoscopy on 06/11/2021 that revealed bilobar coaptation of the prostate with a fairly tight fossa, but otherwise unremarkable.  His most recent PSA on 11/30/2022 was undetectable.  He reports feeling fatigue and needing a power nap a few times a week. His hot flashes are gone. He still has a low libido and no erections. He has nocturia x2 which has improved from about 4 times a night previously. He stopped taking Flomax and Oxybutynin in January. He does not have leakage. His urgency is improved.   Results for orders placed or performed in visit on 12/07/22  Bladder Scan (Post Void Residual) in office  Result Value Ref Range   Scan Result 56      PMH: Past Medical History:  Diagnosis Date   Arthritis    Cancer    Hypertension    Prostate cancer 08/21/2019    Surgical History: Past Surgical History:  Procedure  Laterality Date   CYSTOSCOPY N/A 12/03/2019   Procedure: CYSTOSCOPY;  Surgeon: Vanna Scotland, MD;  Location: [facility];  Service: Urology;  Laterality: N/A;   NO PAST SURGERIES     RADIOACTIVE SEED IMPLANT N/A 12/03/2019   Procedure: RADIOACTIVE SEED IMPLANT/BRACHYTHERAPY IMPLANT;  Surgeon: Vanna Scotland, MD;  Location: [facility] OR[facility];  Service: Urology;  Laterality: N/A;    Home Medications:  Allergies as of 12/07/2022       Reactions   Shellfish Allergy Itching, Hives, Nausea Only        Medication List        Accurate as of December 07, 2022 10:51 AM. If you have any questions, ask your nurse or doctor.          STOP taking these medications    oxybutynin 10 MG 24 hr tablet Commonly known as: DITROPAN-XL Stopped by: Vanna Scotland, MD   tamsulosin 0.4 MG Caps capsule Commonly known as: FLOMAX Stopped by: Vanna Scotland, MD       TAKE these medications    CALCIUM 1200+D3 PO Take 1 tablet by mouth daily.   cyanocobalamin 1000 MCG tablet Commonly known as: VITAMIN B12 Take 1,000 mcg by mouth daily.   etanercept 25 MG injection Commonly known as: ENBREL Inject 25 mg into the skin.   folic acid 1 MG tablet Commonly known as: FOLVITE Take 1 mg by mouth daily.   ibuprofen 200 MG tablet Commonly known as: ADVIL Take 600-800 mg by mouth 2 (two) times daily as  needed for headache or moderate pain.   lisinopril 20 MG tablet Commonly known as: ZESTRIL Take 20 mg by mouth daily.   methotrexate 2.5 MG tablet Commonly known as: RHEUMATREX SMARTSIG:5 Tablet(s) By Mouth Once a Week        Allergies:  Allergies  Allergen Reactions   Shellfish Allergy Itching, Hives and Nausea Only    Social History:  reports that he has never smoked. He has never used smokeless tobacco. He reports that he does not currently use alcohol. He reports that he does not use drugs.   Physical Exam: BP (!) 165/79   Pulse 65   Ht 6' (1.829 m)   Wt 224 lb (101.6 kg)   BMI 30.38  kg/m   Constitutional:  Alert and oriented, No acute distress. HEENT: [place] AT, moist mucus membranes.  Trachea midline, no masses. Neurologic: Grossly intact, no focal deficits, moving all 4 extremities. Psychiatric: Normal mood and affect.  Assessment & Plan:    Prostate Cancer  -  High-risk; status post IMRT and ADT.   - His PSA remains undetectable. Discussed the concept of nadir. Check a PSA again in six months, lab only, and follow up in office annually.   Urinary frequency  - Symptoms are improving and should continue to do so with time.  - No further medications at this time.  Erectile Dysfunction  - Uses the Sildenafil as needed.   - His libido is still diminished so he's not interested in pursuing any other options.   - Discussed alternatives such as Cialis vs Injectables. He'll let us know if he wants to proceed with any of that.  I have reviewed the above documentation for accuracy and completeness, and I agree with the above.   Vanna Scotland, MD   Return in about 1 year (around 12/07/2023) for PSA .   [facility] [address], [address] [place], [place] [address] [phone]

## 2023-06-02 ENCOUNTER — Other Ambulatory Visit: Payer: Medicare Other

## 2023-06-02 DIAGNOSIS — C61 Malignant neoplasm of prostate: Secondary | ICD-10-CM

## 2023-06-03 LAB — PSA: Prostate Specific Ag, Serum: <0.1 ng/mL (ref 0.0–4.0)

## 2023-09-21 LAB — EXTERNAL GENERIC LAB PROCEDURE: COLOGUARD: NEGATIVE

## 2023-09-21 LAB — COLOGUARD: COLOGUARD: NEGATIVE

## 2023-11-18 ENCOUNTER — Other Ambulatory Visit: Payer: Self-pay

## 2023-11-18 DIAGNOSIS — C61 Malignant neoplasm of prostate: Secondary | ICD-10-CM

## 2023-11-19 LAB — PSA: Prostate Specific Ag, Serum: <0.1 ng/mL (ref 0.0–4.0)

## 2023-11-24 ENCOUNTER — Other Ambulatory Visit: Payer: Self-pay

## 2023-12-06 ENCOUNTER — Ambulatory Visit: Payer: Self-pay | Admitting: Urology
